# Patient Record
Sex: Female | Born: 1990 | Race: Black or African American | Hispanic: No | Marital: Single | Smoking: Never smoker
Health system: Southern US, Community
[De-identification: ages and names within clinical notes are randomized; demographics above are authoritative.]

## PROBLEM LIST (undated history)

## (undated) DIAGNOSIS — F419 Anxiety disorder, unspecified: Secondary | ICD-10-CM

## (undated) DIAGNOSIS — E119 Type 2 diabetes mellitus without complications: Secondary | ICD-10-CM

## (undated) DIAGNOSIS — F32A Depression, unspecified: Secondary | ICD-10-CM

## (undated) DIAGNOSIS — R040 Epistaxis: Secondary | ICD-10-CM

## (undated) DIAGNOSIS — F329 Major depressive disorder, single episode, unspecified: Secondary | ICD-10-CM

## (undated) DIAGNOSIS — G43909 Migraine, unspecified, not intractable, without status migrainosus: Secondary | ICD-10-CM

---
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
## 2010-10-18 ENCOUNTER — Emergency Department (HOSPITAL_BASED_OUTPATIENT_CLINIC_OR_DEPARTMENT_OTHER)
Admission: EM | Admit: 2010-10-18 | Discharge: 2010-10-18 | Disposition: A | Discharge status: Home / Self Care | Payer: Self-pay | Attending: Emergency Medicine | Admitting: Emergency Medicine

## 2010-10-18 ENCOUNTER — Encounter: Payer: Self-pay | Admitting: Family Medicine

## 2010-10-18 DIAGNOSIS — R339 Retention of urine, unspecified: Secondary | ICD-10-CM | POA: Insufficient documentation

## 2010-10-18 DIAGNOSIS — R109 Unspecified abdominal pain: Secondary | ICD-10-CM | POA: Insufficient documentation

## 2010-10-18 LAB — URINALYSIS, ROUTINE W REFLEX MICROSCOPIC
Bilirubin Urine: NEGATIVE
Bilirubin Urine: NEGATIVE
Glucose, UA: NEGATIVE mg/dL
Hgb urine dipstick: NEGATIVE
Ketones, ur: NEGATIVE
Ketones, ur: NEGATIVE mg/dL
Nitrite: NEGATIVE
Nitrite: NEGATIVE
Protein, ur: NEGATIVE mg/dL
Specific Gravity, Urine: 1.028
Specific Gravity, Urine: 1.017 (ref 1.005–1.030)
Urobilinogen, UA: 1
Urobilinogen, UA: 1 mg/dL (ref 0.0–1.0)
pH: 7.5 (ref 5.0–8.0)

## 2010-10-18 LAB — PREGNANCY, URINE: Preg Test, Ur: NEGATIVE

## 2010-10-18 LAB — URINE MICROSCOPIC-ADD ON

## 2010-10-18 LAB — WET PREP, GENITAL
Clue Cells Wet Prep HPF POC: NONE SEEN
Yeast Wet Prep HPF POC: NONE SEEN

## 2010-10-18 LAB — BASIC METABOLIC PANEL
BUN: 8 mg/dL (ref 6–23)
CO2: 25 mEq/L (ref 19–32)
Calcium: 10.2 mg/dL (ref 8.4–10.5)
Chloride: 102 mEq/L (ref 96–112)
Creatinine, Ser: 0.7 mg/dL (ref 0.50–1.10)
GFR calc Af Amer: >90 mL/min (ref >90)
GFR calc non Af Amer: >90 mL/min (ref >90)
Glucose, Bld: 129 mg/dL — ABNORMAL HIGH (ref 70–99)
Potassium: 3.9 mEq/L (ref 3.5–5.1)
Sodium: 139 mEq/L (ref 135–145)

## 2010-10-18 LAB — GC/CHLAMYDIA PROBE AMP, GENITAL: Chlamydia, DNA Probe: NEGATIVE

## 2010-10-18 NOTE — ED Notes (Signed)
Have encouraged Pt. To urinate and also to put on hospital gown.
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
## 2010-10-18 NOTE — ED Notes (Signed)
Pt c/o "not being able to urinate" x 2wks. Pt c/o lower abdomen and low back pain since last Tuesday.

## 2012-02-26 ENCOUNTER — Encounter (HOSPITAL_COMMUNITY): Payer: Self-pay | Admitting: *Deleted

## 2012-02-26 ENCOUNTER — Emergency Department (HOSPITAL_COMMUNITY): Payer: Self-pay

## 2012-02-26 ENCOUNTER — Emergency Department (HOSPITAL_COMMUNITY)
Admission: EM | Admit: 2012-02-26 | Discharge: 2012-02-26 | Disposition: A | Discharge status: Home / Self Care | Payer: Self-pay | Attending: Emergency Medicine | Admitting: Emergency Medicine

## 2012-02-26 DIAGNOSIS — S20229A Contusion of unspecified back wall of thorax, initial encounter: Secondary | ICD-10-CM

## 2012-02-26 DIAGNOSIS — W108XXA Fall (on) (from) other stairs and steps, initial encounter: Secondary | ICD-10-CM

## 2012-02-26 DIAGNOSIS — Y9229 Other specified public building as the place of occurrence of the external cause: Secondary | ICD-10-CM | POA: Insufficient documentation

## 2012-02-26 DIAGNOSIS — Y9301 Activity, walking, marching and hiking: Secondary | ICD-10-CM | POA: Insufficient documentation

## 2012-02-26 DIAGNOSIS — S0993XA Unspecified injury of face, initial encounter: Secondary | ICD-10-CM | POA: Insufficient documentation

## 2012-02-26 DIAGNOSIS — S0990XA Unspecified injury of head, initial encounter: Secondary | ICD-10-CM | POA: Insufficient documentation

## 2012-02-26 DIAGNOSIS — S8990XA Unspecified injury of unspecified lower leg, initial encounter: Secondary | ICD-10-CM | POA: Insufficient documentation

## 2012-02-26 MED ORDER — IBUPROFEN 800 MG PO TABS
800.0000 mg | ORAL_TABLET | Freq: Once | ORAL | Status: AC
  Administered 2012-02-26: 800 mg via ORAL
  Filled 2012-02-26: qty 1
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
## 2012-02-26 NOTE — ED Provider Notes (Signed)
This was a shared encounter with a resident.  On my exam this young female who presents following the falls in no distress, with no neurologic deficiencies.  Vital signs are stable.  Though there was questionable finding on the CT scan, the absence of neurologic findings, the absence of distress, is reassuring.  Gerhard Munch, MD 02/26/12 2326

## 2012-02-26 NOTE — ED Notes (Signed)
[id number]<AV> Expected date:02/26/12<BR> Expected time: 2:31 PM<BR> Means of arrival:Ambulance<BR> Comments:<BR> Fall

## 2012-02-26 NOTE — ED Notes (Signed)
Pt requesting no visitors at this time only godmother and she is not here.

## 2012-02-26 NOTE — ED Notes (Signed)
Per EMS: pt coming from church, fell backwards 2-3 steps, per EMS upon their arrival pt very anxious, hyperventilating c/o neck and back pain.
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
## 2012-02-26 NOTE — ED Notes (Signed)
Received pt in rm 16, pt hyperventilating, instructed pr to slow down her breathing; when asked about pain pt only moans, not answering any questions at this time. Pt asked multiple times to open her eyes; pupils are equal and reactive. Awaiting for MD to remove pt off of the LSB.

## 2014-06-05 ENCOUNTER — Emergency Department (INDEPENDENT_AMBULATORY_CARE_PROVIDER_SITE_OTHER)
Admission: EM | Admit: 2014-06-05 | Discharge: 2014-06-05 | Disposition: A | Discharge status: Home / Self Care | Payer: Self-pay | Source: Home / Self Care | Attending: Family Medicine | Admitting: Family Medicine

## 2014-06-05 ENCOUNTER — Encounter (HOSPITAL_COMMUNITY): Payer: Self-pay | Admitting: Emergency Medicine

## 2014-06-05 DIAGNOSIS — K2100 Gastro-esophageal reflux disease with esophagitis, without bleeding: Secondary | ICD-10-CM

## 2014-06-05 DIAGNOSIS — A084 Viral intestinal infection, unspecified: Secondary | ICD-10-CM

## 2014-06-05 MED ORDER — OMEPRAZOLE 40 MG PO CPDR: 40.0000 mg | DELAYED_RELEASE_CAPSULE | Freq: Every day | ORAL | Status: DC

## 2014-06-05 MED ORDER — ONDANSETRON HCL 4 MG PO TABS: 4.0000 mg | ORAL_TABLET | Freq: Three times a day (TID) | ORAL | Status: DC | PRN

## 2014-06-05 NOTE — ED Notes (Signed)
Patient c/o burning sensations in her mouth and tongue x 10 days. Patient denies any change in diet. Patient has been gargling salt water with no change. She reports her taste buds seem more prominent. Patient is in NAD.

## 2014-06-05 NOTE — Discharge Instructions (Signed)
The cause of your symptoms is not immediately clear but may be due to severe reflux as well as a stomach infection called viral gastroenteritis. The symptoms should resolve over the next 3-4 days. Please go to the emergency room if you get worse. Please start the Prilosec and take it for 14 days then as needed. Please use the Zofran for nausea.

## 2014-06-05 NOTE — ED Provider Notes (Signed)
CSN: [id number]     Arrival date & time 06/05/14  [id number] History   First MD Initiated Contact with Patient 06/05/14 1118     Chief Complaint  Patient presents with  . Oral Swelling   (Consider location/radiation/quality/duration/timing/severity/associated sxs/prior Treatment) HPI  Tongue pain. Started 11 days ago. Worse liquids. Decreased taste w/ foods. Salt water gargling w/ a few seconds of improvement. Constant. Getting worse. Associated w/ vomiting and diarrhea. 3x emesis daily and 2 x diarrhea daily. Non-bloody emesis and BM. Denies abd pain, dysuria, frequency, fevers, CP, SOB, palpitations, lower abdominal pain, vaginal discharge, back pain. Endorses some heartburn over the last several weeks. Not sexually active - still a virgin.  No visual abnormality of the mouth per pt.    History reviewed. No pertinent past medical history. History reviewed. No pertinent past surgical history. Family History  Problem Relation Age of Onset  . Hypertension Mother   . Gout Father    History  Substance Use Topics  . Smoking status: Never Smoker   . Smokeless tobacco: Not on file  . Alcohol Use: No   OB History    No data available     Review of Systems Per HPI with all other pertinent systems negative.   Allergies  Review of patient's allergies indicates no known allergies.  Home Medications   Prior to Admission medications   Medication Sig Start Date End Date Taking? Authorizing Provider  acetaminophen (TYLENOL) 500 MG tablet Take 500 mg by mouth every 6 (six) hours as needed for pain.    Historical Provider, MD  ibuprofen (ADVIL,MOTRIN) 200 MG tablet Take 400 mg by mouth every 6 (six) hours as needed for pain.    Historical Provider, MD  omeprazole (PRILOSEC) 40 MG capsule Take 1 capsule (40 mg total) by mouth daily. 06/05/14   Ozella Rocksavid J Merrell, MD  ondansetron (ZOFRAN) 4 MG tablet Take 1 tablet (4 mg total) by mouth every 8 (eight) hours as needed for nausea or vomiting. 06/05/14    Ozella Rocksavid J Merrell, MD   BP 125/64 mmHg  Pulse 73  Temp(Src) 98.4 F (36.9 C) (Oral)  Resp 16  SpO2 100%  LMP 05/19/2014 Physical Exam Physical Exam  Constitutional: oriented to person, place, and time. appears well-developed and well-nourished. No distress.  HENT:  Head: Normocephalic and atraumatic.  Oropharyngeal cavity normal. Tongue normal. Articulation normal. No cervical lymphadenopathy Eyes: EOMI. PERRL.  Neck: Normal range of motion.  Cardiovascular: RRR, no m/r/g, 2+ distal pulses,  Pulmonary/Chest: Effort normal and breath sounds normal. No respiratory distress.  Abdominal: Soft. Bowel sounds are normal. NonTTP, no distension.  Musculoskeletal: Normal range of motion. Non ttp, no effusion.  Neurological: alert and oriented to person, place, and time.  Skin: Skin is warm. No rash noted. non diaphoretic.  Psychiatric: normal mood and affect. behavior is normal. Judgment and thought content normal.   ED Course  Procedures (including critical care time) Labs Review Labs Reviewed - No data to display  Imaging Review No results found.   MDM   1. Reflux esophagitis   2. Viral gastroenteritis    Patient symptoms likely secondary to reflux esophagitis complicated by a viral gastroenteritis. Zofran for nausea. Patient to start Imodium and a probiotic. Start Prilosec and use for 14 days then as needed. Patient can also start using Zantac for additional relief. Patient go to the emergency room she gets worse.    Ozella Rocksavid J Merrell, MD 06/05/14 [id number]

## 2014-12-10 ENCOUNTER — Encounter (HOSPITAL_COMMUNITY): Payer: Self-pay | Admitting: Emergency Medicine

## 2014-12-10 ENCOUNTER — Emergency Department (HOSPITAL_COMMUNITY)
Admission: EM | Admit: 2014-12-10 | Discharge: 2014-12-10 | Disposition: A | Discharge status: Home / Self Care | Payer: Self-pay | Attending: Emergency Medicine | Admitting: Emergency Medicine

## 2014-12-10 DIAGNOSIS — R51 Headache: Secondary | ICD-10-CM | POA: Insufficient documentation

## 2014-12-10 DIAGNOSIS — R040 Epistaxis: Secondary | ICD-10-CM

## 2014-12-10 DIAGNOSIS — R42 Dizziness and giddiness: Secondary | ICD-10-CM | POA: Insufficient documentation

## 2014-12-10 LAB — CBC WITH DIFFERENTIAL/PLATELET
BASOS ABS: 0 10*3/uL (ref 0.0–0.1)
BASOS PCT: 0 %
Eosinophils Absolute: 0.1 10*3/uL (ref 0.0–0.7)
Eosinophils Relative: 1 %
HEMATOCRIT: 38.7 % (ref 36.0–46.0)
HEMOGLOBIN: 12.3 g/dL (ref 12.0–15.0)
LYMPHS PCT: 19 %
Lymphs Abs: 1.7 10*3/uL (ref 0.7–4.0)
MCH: 25.2 pg — ABNORMAL LOW (ref 26.0–34.0)
MCHC: 31.8 g/dL (ref 30.0–36.0)
MCV: 79.3 fL (ref 78.0–100.0)
MONO ABS: 0.6 10*3/uL (ref 0.1–1.0)
Monocytes Relative: 6 %
NEUTROS ABS: 6.9 10*3/uL (ref 1.7–7.7)
NEUTROS PCT: 74 %
Platelets: 332 10*3/uL (ref 150–400)
RBC: 4.88 MIL/uL (ref 3.87–5.11)
RDW: 14 % (ref 11.5–15.5)
WBC: 9.3 10*3/uL (ref 4.0–10.5)

## 2014-12-10 NOTE — ED Notes (Signed)
Patient states having nose bleeds daily for 2 weeks.   Patient states last one this morning around 0915.   Patient states they usually last approximately 3 minutes, but the one today lasted 5 minutes and "it was a lot of blood".   Patient denies other symptoms.

## 2014-12-10 NOTE — Discharge Instructions (Signed)
Follow up with primary care doctor. See information below.    Nosebleed Nosebleeds are common. They are due to a crack in the inside lining of your nose (mucous membrane) or from a small blood vessel that starts to bleed. Nosebleeds can be caused by many conditions, such as injury, infections, dry mucous membranes or dry climate, medicines, nose picking, and home heating and cooling systems. Most nosebleeds come from blood vessels in the front of your nose. HOME CARE INSTRUCTIONS   Try controlling your nosebleed by pinching your nostrils gently and continuously for at least 10 minutes.  Avoid blowing or sniffing your nose for a number of hours after having a nosebleed.  Do not put gauze inside your nose yourself. If your nose was packed by your health care provider, try to maintain the pack inside of your nose until your health care provider removes it.  If a gauze pack was used and it starts to fall out, gently replace it or cut off the end of it.  If a balloon catheter was used to pack your nose, do not cut or remove it unless your health care provider has instructed you to do that.  Avoid lying down while you are having a nosebleed. Sit up and lean forward.  Use a nasal spray decongestant to help with a nosebleed as directed by your health care provider.  Do not use petroleum jelly or mineral oil in your nose. These can drip into your lungs.  Maintain humidity in your home by using less air conditioning or by using a humidifier.  Aspirinand blood thinners make bleeding more likely. If you are prescribed these medicines and you suffer from nosebleeds, ask your health care provider if you should stop taking the medicines or adjust the dose. Do not stop medicines unless directed by your health care provider  Resume your normal activities as you are able, but avoid straining, lifting, or bending at the waist for several days.  If your nosebleed was caused by dry mucous membranes, use  over-the-counter saline nasal spray or gel. This will keep the mucous membranes moist and allow them to heal. If you must use a lubricant, choose the water-soluble variety. Use it only sparingly, and do not use it within several hours of lying down.  Keep all follow-up visits as directed by your health care provider. This is important. SEEK MEDICAL CARE IF:  You have a fever.  You get frequent nosebleeds.  You are getting nosebleeds more often. SEEK IMMEDIATE MEDICAL CARE IF:  Your nosebleed lasts longer than 20 minutes.  Your nosebleed occurs after an injury to your face, and your nose looks crooked or broken.  You have unusual bleeding from other parts of your body.  You have unusual bruising on other parts of your body.  You feel light-headed or you faint.  You become sweaty.  You vomit blood.  Your nosebleed occurs after a head injury.   This information is not intended to replace advice given to you by your health care provider. Make sure you discuss any questions you have with your health care provider.   Document Released: 10/13/2004 Document Revised: 01/24/2014 Document Reviewed: 08/19/2013 Elsevier Interactive Patient Education [employer]2016 [employer]

## 2014-12-10 NOTE — ED Notes (Signed)
Pt is in stable condition upon d/c an ambulates from ED. 

## 2014-12-10 NOTE — ED Provider Notes (Signed)
CSN: [id number]     Arrival date & time 12/10/14  4098 History   First MD Initiated Contact with Patient 12/10/14 1003     Chief Complaint  Patient presents with  . Epistaxis     (Consider location/radiation/quality/duration/timing/severity/associated sxs/prior Treatment) HPI Martha Lopez is a 24 y.o. female with no medical problems, presents to emergency department complaining bloody nose. Patient states that she has had bleeding from bilateral nares every day for the last 2 weeks. Patient states episodes occur random. She denies any nasal trauma. She denies any pain. She states she is usually able to stop the bleeding with pressure. She denies any active bleeding at this time. She reports occasional headaches. Also reports some dizziness. Last episode was at 9 AM this morning. Denies history of blood clotting disorders and she does not take any blood thinners. Does not take any supplements or over-the-counter medications.  History reviewed. No pertinent past medical history. History reviewed. No pertinent past surgical history. Family History  Problem Relation Age of Onset  . Hypertension Mother   . Gout Father    Social History  Substance Use Topics  . Smoking status: Never Smoker   . Smokeless tobacco: None  . Alcohol Use: No   OB History    No data available     Review of Systems  Constitutional: Negative for fever and chills.  HENT: Positive for nosebleeds. Negative for facial swelling and mouth sores.   Respiratory: Negative for cough, chest tightness and shortness of breath.   Cardiovascular: Negative for chest pain, palpitations and leg swelling.  Gastrointestinal: Negative for nausea, vomiting, abdominal pain and diarrhea.  Musculoskeletal: Negative for myalgias, arthralgias, neck pain and neck stiffness.  Skin: Negative for rash.  Neurological: Positive for dizziness and headaches. Negative for weakness.  All other systems reviewed and are  negative.     Allergies  Review of patient's allergies indicates no known allergies.  Home Medications   Prior to Admission medications   Medication Sig Start Date End Date Taking? Authorizing Provider  acetaminophen (TYLENOL) 500 MG tablet Take 500 mg by mouth every 6 (six) hours as needed for pain.    Historical Provider, MD  ibuprofen (ADVIL,MOTRIN) 200 MG tablet Take 400 mg by mouth every 6 (six) hours as needed for pain.    Historical Provider, MD  omeprazole (PRILOSEC) 40 MG capsule Take 1 capsule (40 mg total) by mouth daily. Patient not taking: Reported on 12/10/2014 06/05/14   Ozella Rocks, MD  ondansetron (ZOFRAN) 4 MG tablet Take 1 tablet (4 mg total) by mouth every 8 (eight) hours as needed for nausea or vomiting. Patient not taking: Reported on 12/10/2014 06/05/14   Ozella Rocks, MD   BP 126/74 mmHg  Pulse 78  Temp(Src) 98.6 F (37 C) (Oral)  Resp 19  Ht  (1.575 m)  Wt 85.73 kg  BMI 34.56 kg/m2  SpO2 100%  LMP 12/10/2014 Physical Exam  Constitutional: She is oriented to person, place, and time. She appears well-developed and well-nourished. No distress.  HENT:  Head: Normocephalic.  Dried blood in bilateral nares. No active bleeding.   Eyes: Conjunctivae and EOM are normal. Pupils are equal, round, and reactive to light.  Neck: Neck supple.  Cardiovascular: Normal rate, regular rhythm and normal heart sounds.   Pulmonary/Chest: Effort normal and breath sounds normal. No respiratory distress. She has no wheezes. She has no rales.  Abdominal: Soft. Bowel sounds are normal. She exhibits no distension. There is no  tenderness. There is no rebound.  Musculoskeletal: She exhibits no edema.  Neurological: She is alert and oriented to person, place, and time. No cranial nerve deficit. Coordination normal.  Skin: Skin is warm and dry.  Psychiatric: She has a normal mood and affect. Her behavior is normal.  Nursing note and vitals reviewed.   ED Course   Procedures (including critical care time) Labs Review Labs Reviewed  CBC WITH DIFFERENTIAL/PLATELET - Abnormal; Notable for the following:    MCH 25.2 (*)    All other components within normal limits    Imaging Review No results found. I have personally reviewed and evaluated these images and lab results as part of my medical decision-making.   EKG Interpretation None      MDM   Final diagnoses:  Epistaxis    Patient emergency department with epistaxis. No active bleeding. No history of bleeding disorders. She is not taking her blood thinners. She has no other associated complaints. Denies joint aches. No bruising or skin discoloration. CBC normal. Home with vasiline to nares, advised to get a humidifier, pressure if bleeding. Follow up with primary care doctor.   Filed Vitals:   12/10/14 0957 12/10/14 0959  BP:  126/74  Pulse:  78  Temp:  98.6 F (37 C)  TempSrc:  Oral  Resp:  19  Height: 5\' 2"  (1.575 m)   Weight: 85.73 kg   SpO2:  100%       Jaynie Crumbleatyana Elior Robinette, PA-C 12/10/14 1130  Melene Planan Floyd, DO 12/10/14 1346

## 2015-10-21 ENCOUNTER — Emergency Department (HOSPITAL_COMMUNITY)
Admission: EM | Admit: 2015-10-21 | Discharge: 2015-10-22 | Disposition: A | Discharge status: Home / Self Care | Payer: No Typology Code available for payment source | Attending: Emergency Medicine | Admitting: Emergency Medicine

## 2015-10-21 ENCOUNTER — Encounter (HOSPITAL_COMMUNITY): Payer: Self-pay | Admitting: *Deleted

## 2015-10-21 DIAGNOSIS — Y92410 Unspecified street and highway as the place of occurrence of the external cause: Secondary | ICD-10-CM | POA: Diagnosis not present

## 2015-10-21 DIAGNOSIS — S0083XA Contusion of other part of head, initial encounter: Secondary | ICD-10-CM | POA: Diagnosis not present

## 2015-10-21 DIAGNOSIS — Y999 Unspecified external cause status: Secondary | ICD-10-CM | POA: Diagnosis not present

## 2015-10-21 DIAGNOSIS — Y939 Activity, unspecified: Secondary | ICD-10-CM | POA: Diagnosis not present

## 2015-10-21 DIAGNOSIS — S0990XA Unspecified injury of head, initial encounter: Secondary | ICD-10-CM | POA: Diagnosis present
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
## 2015-10-22 MED ORDER — CYCLOBENZAPRINE HCL 5 MG PO TABS: 5.0000 mg | ORAL_TABLET | Freq: Three times a day (TID) | ORAL | 0 refills | Status: DC | PRN

## 2015-10-22 MED ORDER — CYCLOBENZAPRINE HCL 10 MG PO TABS
5.0000 mg | ORAL_TABLET | Freq: Once | ORAL | Status: AC
  Administered 2015-10-22: 5 mg via ORAL
  Filled 2015-10-22: qty 1

## 2015-10-22 MED ORDER — IBUPROFEN 400 MG PO TABS
600.0000 mg | ORAL_TABLET | Freq: Once | ORAL | Status: AC
  Administered 2015-10-22: 600 mg via ORAL
  Filled 2015-10-22: qty 1

## 2015-10-22 MED ORDER — NAPROXEN 500 MG PO TABS: 500.0000 mg | ORAL_TABLET | Freq: Two times a day (BID) | ORAL | 0 refills | Status: DC
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
## 2015-10-22 NOTE — Discharge Instructions (Signed)
Do not take the muscle relaxant if driving as it will make you sleepy.  °

## 2016-09-15 ENCOUNTER — Encounter (HOSPITAL_COMMUNITY): Payer: Self-pay | Admitting: Emergency Medicine

## 2016-09-15 ENCOUNTER — Ambulatory Visit (HOSPITAL_COMMUNITY)
Admission: EM | Admit: 2016-09-15 | Discharge: 2016-09-15 | Disposition: A | Discharge status: Home / Self Care | Payer: Self-pay | Attending: Family Medicine | Admitting: Family Medicine

## 2016-09-15 DIAGNOSIS — R51 Headache: Secondary | ICD-10-CM

## 2016-09-15 DIAGNOSIS — R519 Headache, unspecified: Secondary | ICD-10-CM

## 2016-09-15 MED ORDER — KETOROLAC TROMETHAMINE 60 MG/2ML IM SOLN
INTRAMUSCULAR | Status: AC
  Filled 2016-09-15: qty 2

## 2016-09-15 MED ORDER — KETOROLAC TROMETHAMINE 60 MG/2ML IM SOLN
60.0000 mg | Freq: Once | INTRAMUSCULAR | Status: AC
  Administered 2016-09-15: 60 mg via INTRAMUSCULAR

## 2016-09-15 MED ORDER — ONDANSETRON 4 MG PO TBDP
ORAL_TABLET | ORAL | Status: AC
  Filled 2016-09-15: qty 1

## 2016-09-15 MED ORDER — ONDANSETRON 4 MG PO TBDP
4.0000 mg | ORAL_TABLET | Freq: Once | ORAL | Status: AC
  Administered 2016-09-15: 4 mg via ORAL

## 2016-09-15 NOTE — ED Triage Notes (Signed)
Pt reports a headache x4 days.  She reports pain in her eyes, sleeplessness with vomiting last night and she states she feels the head hurting when she walks and sneezes.

## 2016-09-15 NOTE — ED Provider Notes (Signed)
  [facility]   [id number] 09/15/16 Arrival Time: 1536  ASSESSMENT & PLAN:  1. Acute nonintractable headache, unspecified headache type     Meds ordered this encounter  Medications  . ketorolac (TORADOL) injection 60 mg  . ondansetron (ZOFRAN-ODT) disintegrating tablet 4 mg   Feeling better after IM medications in office. Headache precautions given. Will seek prompt medical attention if worsens. Reviewed expectations re: course of current medical issues. Questions answered. Outlined signs and symptoms indicating need for more acute intervention. Patient verbalized understanding. After Visit Summary given.   SUBJECTIVE:  Martha Lopez is a 26 y.o. female who presents with complaint of a headache for 3-4 days. On/off. Gradual onset. Describes as throbbing "all over." Does feel in neck also. Afebrile. No confusion. No recent illnesses. Occasional headaches like this. Usu resolve with OTC. Ambulatory without problems. Isolated episode of emesis today. None since. Normal PO intake. Vision normal.  ROS: As per HPI.   OBJECTIVE:  Vitals:   09/15/16 1610  BP: 110/76  Pulse: 95  Resp: 16  Temp: 99.3 F (37.4 C)  TempSrc: Oral  SpO2: 98%    General appearance: alert; no distress Eyes: PERRLA; EOMI; conjunctiva normal HENT: normocephalic; atraumatic; TMs normal; nasal mucosa normal; oral mucosa normal Neck: supple Lungs: clear to auscultation bilaterally Heart: regular rate and rhythm Extremities: no cyanosis or edema; symmetrical with no gross deformities Skin: warm and dry Neurologic: normal gait; normal symmetric reflexes Psychological: alert and cooperative; normal mood and affect  No Known Allergies  History reviewed. No pertinent past medical history. Social History   Social History  . Marital status: Single    Spouse name: N/A  . Number of children: N/A  . Years of education: N/A   Occupational History  . Not on file.   Social History Main  Topics  . Smoking status: Never Smoker  . Smokeless tobacco: Never Used  . Alcohol use No  . Drug use: No  . Sexual activity: No   Other Topics Concern  . Not on file   Social History Narrative  . No narrative on file   Family History  Problem Relation Age of Onset  . Hypertension Mother   . Gout Father    History reviewed. No pertinent surgical history.   Mardella LaymanHagler, Laelyn Blumenthal, MD 09/17/16 1049

## 2017-07-10 ENCOUNTER — Ambulatory Visit (HOSPITAL_COMMUNITY)
Admission: EM | Admit: 2017-07-10 | Discharge: 2017-07-10 | Disposition: A | Discharge status: Home / Self Care | Payer: Self-pay | Attending: Family Medicine | Admitting: Family Medicine

## 2017-07-10 ENCOUNTER — Encounter (HOSPITAL_COMMUNITY): Payer: Self-pay

## 2017-07-10 DIAGNOSIS — Z8249 Family history of ischemic heart disease and other diseases of the circulatory system: Secondary | ICD-10-CM | POA: Insufficient documentation

## 2017-07-10 DIAGNOSIS — Z3202 Encounter for pregnancy test, result negative: Secondary | ICD-10-CM

## 2017-07-10 DIAGNOSIS — J029 Acute pharyngitis, unspecified: Secondary | ICD-10-CM | POA: Insufficient documentation

## 2017-07-10 DIAGNOSIS — Z79899 Other long term (current) drug therapy: Secondary | ICD-10-CM | POA: Insufficient documentation

## 2017-07-10 DIAGNOSIS — R1030 Lower abdominal pain, unspecified: Secondary | ICD-10-CM | POA: Insufficient documentation

## 2017-07-10 DIAGNOSIS — K1370 Unspecified lesions of oral mucosa: Secondary | ICD-10-CM | POA: Insufficient documentation

## 2017-07-10 LAB — POCT URINALYSIS DIP (DEVICE)
GLUCOSE, UA: NEGATIVE mg/dL
Ketones, ur: 80 mg/dL — AB
Leukocytes, UA: NEGATIVE
Nitrite: NEGATIVE
PH: 5.5 (ref 5.0–8.0)
PROTEIN: 30 mg/dL — AB
SPECIFIC GRAVITY, URINE: 1.025 (ref 1.005–1.030)
UROBILINOGEN UA: 1 mg/dL (ref 0.0–1.0)

## 2017-07-10 LAB — POCT RAPID STREP A: Streptococcus, Group A Screen (Direct): NEGATIVE

## 2017-07-10 LAB — POCT PREGNANCY, URINE: Preg Test, Ur: NEGATIVE

## 2017-07-10 MED ORDER — MAGIC MOUTHWASH W/LIDOCAINE: 5.0000 mL | Freq: Four times a day (QID) | ORAL | 0 refills | Status: AC

## 2017-07-10 NOTE — ED Triage Notes (Signed)
Pt presents with complaints of sores in right side of mouth, headache, swollen lymph nodes in neck on right side and lower abdominal pain x 3 days

## 2017-07-10 NOTE — Discharge Instructions (Signed)
These lesions are most likely viral and will improve on their own  Please use mouthwash up to 4 times a day  Please take Tylenol and ibuprofen for pain  Please return if symptoms worsening, not improving in 5 to 7 days.

## 2017-07-10 NOTE — ED Provider Notes (Signed)
[facility]    CSN: [id number] Arrival date & time: 07/10/17  1004     History   Chief Complaint Chief Complaint  Patient presents with  . Mouth Lesions    HPI Martha Lopez is a 27 y.o. female no significant past medical history presenting today for evaluation of mouth sores, sore throat, abdominal pain.  Patient states that her symptoms began last Wednesday with a headache and have continued to progress and worsen.  Since she has developed lower abdominal pain for 3 days as well as mouth sores, more specifically on the right side of her mouth.  She has noticed some sore throat and pain with swallowing.  Decreased appetite and one episode of vomiting last night.  She has not had persistent nausea vomiting.  Denies any changes in bowel movements.  Denies diarrhea.  Denies associated dysuria, increased frequency or vaginal discharge.  She declines concern for STDs.  She denies previously having mouth sores similarly.  She denies any associated rash with these sores.  She has been taking ibuprofen, naproxen, salt water gargles as well as peroxide without relief.  Last menstrual period was 5/12, she is not on any form of birth control.  States her periods are typically regular.  HPI   History reviewed. No pertinent past medical history.  Patient Active Problem List   Diagnosis Date Noted  . Fall down stairs 02/26/2012  . Contusion of back(922.31) 02/26/2012    History reviewed. No pertinent surgical history.  OB History   None      Home Medications    Prior to Admission medications   Medication Sig Start Date End Date Taking? Authorizing Provider  acetaminophen (TYLENOL) 500 MG tablet Take 500 mg by mouth every 6 (six) hours as needed for pain.    [provider]  cyclobenzaprine (FLEXERIL) 5 MG tablet Take 1 tablet (5 mg total) by mouth 3 (three) times daily as needed for muscle spasms. 10/22/15   Janne Napoleon, NP  ibuprofen (ADVIL,MOTRIN) 200 MG tablet  Take 400 mg by mouth every 6 (six) hours as needed for pain.    [provider]  magic mouthwash w/lidocaine SOLN Take 5 mLs by mouth 4 (four) times daily for 7 days. 07/10/17 07/17/17  Copeland Neisen C, PA-C  naproxen (NAPROSYN) 500 MG tablet Take 1 tablet (500 mg total) by mouth 2 (two) times daily. 10/22/15   Janne Napoleon, NP  omeprazole (PRILOSEC) 40 MG capsule Take 1 capsule (40 mg total) by mouth daily. Patient not taking: Reported on 12/10/2014 06/05/14   Ozella Rocks, MD  ondansetron (ZOFRAN) 4 MG tablet Take 1 tablet (4 mg total) by mouth every 8 (eight) hours as needed for nausea or vomiting. Patient not taking: Reported on 12/10/2014 06/05/14   Ozella Rocks, MD    Family History Family History  Problem Relation Age of Onset  . Hypertension Mother   . Gout Father     Social History Social History   Tobacco Use  . Smoking status: Never Smoker  . Smokeless tobacco: Never Used  Substance Use Topics  . Alcohol use: No  . Drug use: No     Allergies   Patient has no known allergies.   Review of Systems Review of Systems  Constitutional: Positive for appetite change. Negative for activity change, chills, fatigue and fever.  HENT: Positive for mouth sores and sore throat. Negative for congestion, ear pain, rhinorrhea, sinus pressure and trouble swallowing.   Eyes:  Negative for discharge and redness.  Respiratory: Negative for cough, chest tightness and shortness of breath.   Cardiovascular: Negative for chest pain.  Gastrointestinal: Positive for abdominal pain. Negative for diarrhea, nausea and vomiting.  Genitourinary: Negative for dysuria, flank pain, genital sores, hematuria and vaginal discharge.  Musculoskeletal: Negative for myalgias.  Skin: Negative for rash.  Neurological: Positive for headaches. Negative for dizziness and light-headedness.     Physical Exam Triage Vital Signs ED Triage Vitals  Enc Vitals Group     BP 07/10/17 1031 138/82      Pulse Rate 07/10/17 1031 96     Resp 07/10/17 1031 19     Temp 07/10/17 1031 99.2 F (37.3 C)     Temp src --      SpO2 07/10/17 1031 100 %     Weight --      Height --      Head Circumference --      Peak Flow --      Pain Score 07/10/17 1032 10     Pain Loc --      Pain Edu? --      Excl. in GC? --    No data found.  Updated Vital Signs BP 138/82   Pulse 96   Temp 99.2 F (37.3 C)   Resp 19   LMP 05/28/2017   SpO2 100%   Visual Acuity Right Eye Distance:   Left Eye Distance:   Bilateral Distance:    Right Eye Near:   Left Eye Near:    Bilateral Near:     Physical Exam  Constitutional: She is oriented to person, place, and time. She appears well-developed and well-nourished. No distress.  HENT:  Head: Normocephalic and atraumatic.  Bilateral ears without tenderness to palpation of external auricle, tragus and mastoid, EAC's without erythema or swelling, TM's with good bony landmarks and cone of light. Non erythematous.  Oral mucosa -with layers of white discoloration, nonremovable to bilateral bugle mucosa and in her leg, multiple erythematous lesions to right mucosa near her mouth.,  Mild tonsillar enlargement with white exudate present bilaterally. Posterior pharynx patent and nonerythematous, no uvula deviation or swelling. Normal phonation.   Eyes: Conjunctivae are normal.  Neck: Neck supple.  Tenderness to palpation of submandibular lymph nodes on right side.  Cardiovascular: Normal rate and regular rhythm.  No murmur heard. Pulmonary/Chest: Effort normal and breath sounds normal. No respiratory distress.  Breathing comfortably at rest, CTABL, no wheezing, rales or other adventitious sounds auscultated  Abdominal: Soft. There is tenderness.  Tenderness to bilateral right and left lower quadrants, negative rebound, negative Rovsing, negative McBurney's  Musculoskeletal: She exhibits no edema.  Neurological: She is alert and oriented to person, place, and  time.  Skin: Skin is warm and dry.  No rash or lesions to hands and feet/soles  Psychiatric: She has a normal mood and affect.  Nursing note and vitals reviewed.        UC Treatments / Results  Labs (all labs ordered are listed, but only abnormal results are displayed) Labs Reviewed  POCT URINALYSIS DIP (DEVICE) - Abnormal; Notable for the following components:      Result Value   Bilirubin Urine SMALL (*)    Ketones, ur 80 (*)    Hgb urine dipstick TRACE (*)    Protein, ur 30 (*)    All other components within normal limits  CULTURE, GROUP A STREP [facility])  HSV CULTURE AND TYPING  POCT RAPID STREP A  POCT PREGNANCY, URINE    EKG None  Radiology No results found.  Procedures Procedures (including critical care time)  Medications Ordered in UC Medications - No data to display  Initial Impression / Assessment and Plan / UC Course  I have reviewed the triage vital signs and the nursing notes.  Pertinent labs & imaging results that were available during my care of the patient were reviewed by me and considered in my medical decision making (see chart for details).     Patient with oral lesions, sore throat, strep test negative, HSV culture obtained.  These appear more viral aphthous ulcers versus hand-foot-and-mouth.  Will provide symptomatic treatment with Magic mouthwash with lidocaine.  Tylenol and ibuprofen for pain and swelling.  Monitor abdominal pain, at this time does not appear to have acute abdomen, likely related to viral illness.  UA negative, pregnancy negative Final Clinical Impressions(s) / UC Diagnoses   Final diagnoses:  Oral lesion  Sore throat     Discharge Instructions     These lesions are most likely viral and will improve on their own  Please use mouthwash up to 4 times a day  Please take Tylenol and ibuprofen for pain  Please return if symptoms worsening, not improving in 5 to 7 days.    ED Prescriptions    Medication Sig  Dispense Auth. Provider   magic mouthwash w/lidocaine SOLN Take 5 mLs by mouth 4 (four) times daily for 7 days. 200 mL Jayma Volpi C, PA-C     Controlled Substance Prescriptions [place] Controlled Substance Registry consulted? Not Applicable   Lew DawesWieters, Maykel Reitter C, [place]PA-C 07/10/17 1119

## 2017-07-12 ENCOUNTER — Emergency Department (HOSPITAL_COMMUNITY)
Admission: EM | Admit: 2017-07-12 | Discharge: 2017-07-12 | Disposition: A | Discharge status: Home / Self Care | Payer: Self-pay | Attending: Emergency Medicine | Admitting: Emergency Medicine

## 2017-07-12 ENCOUNTER — Other Ambulatory Visit: Payer: Self-pay

## 2017-07-12 DIAGNOSIS — K029 Dental caries, unspecified: Secondary | ICD-10-CM | POA: Insufficient documentation

## 2017-07-12 DIAGNOSIS — K047 Periapical abscess without sinus: Secondary | ICD-10-CM | POA: Insufficient documentation

## 2017-07-12 DIAGNOSIS — K1370 Unspecified lesions of oral mucosa: Secondary | ICD-10-CM | POA: Insufficient documentation

## 2017-07-12 LAB — CULTURE, GROUP A STREP (THRC)

## 2017-07-12 LAB — HSV CULTURE AND TYPING

## 2017-07-12 MED ORDER — ACETAMINOPHEN 500 MG PO TABS: 500.0000 mg | ORAL_TABLET | Freq: Four times a day (QID) | ORAL | 0 refills | Status: DC | PRN

## 2017-07-12 MED ORDER — IBUPROFEN 600 MG PO TABS: 600.0000 mg | ORAL_TABLET | Freq: Four times a day (QID) | ORAL | 0 refills | Status: DC | PRN

## 2017-07-12 MED ORDER — PENICILLIN V POTASSIUM 500 MG PO TABS: 500.0000 mg | ORAL_TABLET | Freq: Four times a day (QID) | ORAL | 0 refills | Status: AC

## 2017-07-12 NOTE — Discharge Instructions (Signed)
Medications: Penicillin, ibuprofen, Tylenol  Treatment: Take penicillin 4 times daily.  Make sure to finish all  of this medication.  Alternate ibuprofen and Tylenol as needed for your pain.  You can continue taking Magic mouthwash as prescribed.  Please follow-up with a dentist as soon as possible for further evaluation and treatment of your symptoms.  Follow-up: Please follow-up with a dentist as soon as possible for further evaluation and treatment of your dental infection.  If your symptoms are not improving, please establish care with a primary care provider.  You can call the urgent care or my chart for your HSV test that is pending.  If you do not hear from them soon it is positive, you can call for further treatment of this.  Please return to the emergency department if you develop any new or worsening symptoms.

## 2017-07-12 NOTE — ED Triage Notes (Signed)
Patient to Martha Lopez c/o mouth pain - [facility] and given magic mouthwash without relief. Endorses canker sores all over, making eating difficult. Denies fevers/chills.

## 2017-07-12 NOTE — ED Provider Notes (Signed)
Patient placed in Quick Look pathway, seen and evaluated   Chief Complaint: mouth sores  HPI:   Seen at urgent care 2 days ago, given magic mouthwash no improvement   ROS: mouth sores (one)  Physical Exam:   Gen: No distress  Neuro: Awake and Alert  Skin: Warm    Focused Exam: Shallow ulcerations around hard palate and bucccal mucosa   Initiation of care has begun. The patient has been counseled on the process, plan, and necessity for staying for the completion/evaluation, and the remainder of the medical screening examination    Arthor CaptainHarris, Louis Gaw, Cordelia Poche-C 07/12/17 1629    Mesner, Barbara CowerJason, MD 07/13/17 [phone]

## 2017-07-12 NOTE — ED Provider Notes (Signed)
[facility] EMERGENCY DEPARTMENT Provider Note   CSN: [id number] Arrival date & time: 07/12/17  1545     History   Chief Complaint Chief Complaint  Patient presents with  . Mouth Pain    HPI Martha Lopez is a 27 y.o. female who is previously healthy who presents with a one-week history of painful oral lesions noted after her right upper molar broke off.  She is having dental pain.  She denies any fevers, or neck pain.  She was seen at urgent care 2 days ago and prescribed Magic mouthwash.  HSV is pending and strep culture is negative.  Patient denies any history of HSV or oral sex. She reports that she initially had a sore throat, however this is resolved.   HPI  No past medical history on file.  Patient Active Problem List   Diagnosis Date Noted  . Fall down stairs 02/26/2012  . Contusion of back(922.31) 02/26/2012    No past surgical history on file.   OB History   None      Home Medications    Prior to Admission medications   Medication Sig Start Date End Date Taking? Authorizing Provider  acetaminophen (TYLENOL) 500 MG tablet Take 1 tablet (500 mg total) by mouth every 6 (six) hours as needed. 07/12/17   Michiah Masse, Waylan Boga, PA-C  cyclobenzaprine (FLEXERIL) 5 MG tablet Take 1 tablet (5 mg total) by mouth 3 (three) times daily as needed for muscle spasms. 10/22/15   Janne Napoleon, NP  ibuprofen (ADVIL,MOTRIN) 600 MG tablet Take 1 tablet (600 mg total) by mouth every 6 (six) hours as needed. 07/12/17   Emi Holes, PA-C  magic mouthwash w/lidocaine SOLN Take 5 mLs by mouth 4 (four) times daily for 7 days. 07/10/17 07/17/17  Wieters, Hallie C, PA-C  omeprazole (PRILOSEC) 40 MG capsule Take 1 capsule (40 mg total) by mouth daily. Patient not taking: Reported on 12/10/2014 06/05/14   Ozella Rocks, MD  ondansetron (ZOFRAN) 4 MG tablet Take 1 tablet (4 mg total) by mouth every 8 (eight) hours as needed for nausea or vomiting. Patient not taking:  Reported on 12/10/2014 06/05/14   Ozella Rocks, MD  penicillin v potassium (VEETID) 500 MG tablet Take 1 tablet (500 mg total) by mouth 4 (four) times daily for 7 days. 07/12/17 07/19/17  Emi Holes, PA-C    Family History Family History  Problem Relation Age of Onset  . Hypertension Mother   . Gout Father     Social History Social History   Tobacco Use  . Smoking status: Never Smoker  . Smokeless tobacco: Never Used  Substance Use Topics  . Alcohol use: No  . Drug use: No     Allergies   Patient has no known allergies.   Review of Systems Review of Systems  Constitutional: Negative for fever.  HENT: Positive for dental problem, mouth sores and sore throat (resolved). Negative for ear pain.      Physical Exam Updated Vital Signs BP 118/67 (BP Location: Right Arm)   Pulse 88   Temp 98.5 F (36.9 C) (Oral)   Resp 18   Ht 5\' 2"  (1.575 m)   LMP 06/28/2017 (Exact Date)   SpO2 100%   BMI 34.57 kg/m   Physical Exam  Constitutional: She appears well-developed and well-nourished. No distress.  HENT:  Head: Normocephalic and atraumatic.  Mouth/Throat: Oropharynx is clear and moist. Oral lesions present. No trismus in the jaw. Normal  dentition. Dental abscesses and dental caries present. No uvula swelling. No oropharyngeal exudate, posterior oropharyngeal edema, posterior oropharyngeal erythema or tonsillar abscesses. Tonsils are 0 on the right. Tonsils are 0 on the left. No tonsillar exudate.    Several white shallow ulcers with erythematous base especially in the right buccal space  Eyes: Pupils are equal, round, and reactive to light. Conjunctivae are normal. Right eye exhibits no discharge. Left eye exhibits no discharge. No scleral icterus.  Neck: Normal range of motion. Neck supple. Tracheal deviation: 1, mobile, palpable tender lymph node in the Right submandibular space. No thyromegaly present.  Cardiovascular: Normal rate, regular rhythm, normal heart  sounds and intact distal pulses. Exam reveals no gallop and no friction rub.  No murmur heard. Pulmonary/Chest: Effort normal and breath sounds normal. No stridor. No respiratory distress. She has no wheezes. She has no rales.  Abdominal: Soft. Bowel sounds are normal. She exhibits no distension. There is no tenderness. There is no rebound and no guarding.  Musculoskeletal: She exhibits no edema.  Lymphadenopathy:    She has cervical adenopathy.  Neurological: She is alert. Coordination normal.  Skin: Skin is warm and dry. No rash noted. She is not diaphoretic. No pallor.  Psychiatric: She has a normal mood and affect.  Nursing note and vitals reviewed.    ED Treatments / Results  Labs (all labs ordered are listed, but only abnormal results are displayed) Labs Reviewed - No data to display  EKG None  Radiology No results found.  Procedures Procedures (including critical care time)  Medications Ordered in ED Medications - No data to display   Initial Impression / Assessment and Plan / ED Course  I have reviewed the triage vital signs and the nursing notes.  Pertinent labs & imaging results that were available during my care of the patient were reviewed by me and considered in my medical decision making (see chart for details).     Patient with probable dental abscess.  Doubt Ludwig's angina or deep space infection.  Patient also with several probable aphthous ulcers which may be related to surrounding infection.  Could also be viral.  HSV culture and typing is pending at urgent care and patient is advised to follow-up with if this if she does not hear back from them.  She denies history of HSV or oral exposure, so will defer treatment for this at this time.  Will treat dental abscess with penicillin and ibuprofen and Tylenol to Magic mouthwash.  Patient advised to follow-up with dentist.  Return precautions discussed.  Patient understands and agrees with plan.  Patient vitals  stable throughout ED course and discharged in satisfactory condition.  Final Clinical Impressions(s) / ED Diagnoses   Final diagnoses:  Oral lesion  Dental abscess    ED Discharge Orders        Ordered    penicillin v potassium (VEETID) 500 MG tablet  4 times daily     07/12/17 1721    ibuprofen (ADVIL,MOTRIN) 600 MG tablet  Every 6 hours PRN     07/12/17 1721    acetaminophen (TYLENOL) 500 MG tablet  Every 6 hours PRN     07/12/17 1721       Ramirez Fullbright, Waylan Bogalexandra M, PA-C 07/12/17 1732    Rolland PorterJames, Mark, MD 07/14/17 0040

## 2017-07-14 ENCOUNTER — Telehealth (HOSPITAL_COMMUNITY): Payer: Self-pay

## 2017-07-14 NOTE — Telephone Encounter (Signed)
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 Educated on Herpes and safe sex practices. Pt denies any concerns and verbalized understanding.  

## 2018-07-02 ENCOUNTER — Other Ambulatory Visit: Payer: Self-pay

## 2018-07-02 ENCOUNTER — Emergency Department (HOSPITAL_COMMUNITY)
Admission: EM | Admit: 2018-07-02 | Discharge: 2018-07-03 | Disposition: A | Discharge status: Other Acute Inpatient Hospital | Payer: Self-pay | Attending: Emergency Medicine | Admitting: Emergency Medicine

## 2018-07-02 ENCOUNTER — Encounter (HOSPITAL_COMMUNITY): Payer: Self-pay | Admitting: Emergency Medicine

## 2018-07-02 DIAGNOSIS — Z7289 Other problems related to lifestyle: Secondary | ICD-10-CM

## 2018-07-02 DIAGNOSIS — R45851 Suicidal ideations: Secondary | ICD-10-CM | POA: Insufficient documentation

## 2018-07-02 NOTE — ED Triage Notes (Signed)
Pt AOX3. PT DENIES ANY PAIN OR DISCOMFORT. FAMILY TRANSPORTED PT TO ED. PT REPORTED FEELING SUICIDAL WITH A PLAN TO TAKE PILLS. DENIES hi AND AVH AT THIS TIME. PT REPORTS THESE THOUGHTS HAS COME AND GO OVER THE PAST 8 YEARS.

## 2018-07-03 LAB — RAPID URINE DRUG SCREEN, HOSP PERFORMED
Amphetamines: NOT DETECTED
Barbiturates: NOT DETECTED
Benzodiazepines: NOT DETECTED
Cocaine: NOT DETECTED
Opiates: NOT DETECTED
Tetrahydrocannabinol: NOT DETECTED

## 2018-07-03 NOTE — ED Notes (Signed)
Pt discharged home with family. Pt verbalizes an understanding of discharge instructions. Pt stable and safe.

## 2018-07-03 NOTE — ED Provider Notes (Signed)
[place] DEPT Provider Note  CSN: [id number] Arrival date & time: 07/02/18 2243  Chief Complaint(s) Suicidal  HPI Martha Lopez is a 28 y.o. female   The history is provided by the patient.  Mental Health Problem Presenting symptoms: self-mutilation and suicidal thoughts   Presenting symptoms: no delusions, no depression, no homicidal ideas, no paranoid behavior, no suicidal threats and no suicide attempt   Patient accompanied by:  Family member Degree of incapacity (severity):  Moderate Timing:  Sporadic Progression:  Waxing and waning Context: stressful life event (verbal altercation with friends)   Context: not alcohol use, not drug abuse and not medication   Treatment compliance:  Untreated Associated symptoms: no anhedonia, no appetite change, no chest pain, no decreased need for sleep, no fatigue, no feelings of worthlessness, no insomnia, no irritability and no poor judgment     Past Medical History History reviewed. No pertinent past medical history. Patient Active Problem List   Diagnosis Date Noted  . Fall down stairs 02/26/2012  . Contusion of back(922.31) 02/26/2012   Home Medication(s) Prior to Admission medications   Medication Sig Start Date End Date Taking? Authorizing Provider  acetaminophen (TYLENOL) 500 MG tablet Take 1 tablet (500 mg total) by mouth every 6 (six) hours as needed. Patient not taking: Reported on 07/03/2018 07/12/17   Frederica Kuster, PA-C  cyclobenzaprine (FLEXERIL) 5 MG tablet Take 1 tablet (5 mg total) by mouth 3 (three) times daily as needed for muscle spasms. Patient not taking: Reported on 07/03/2018 10/22/15   Ashley Murrain, NP  ibuprofen (ADVIL,MOTRIN) 600 MG tablet Take 1 tablet (600 mg total) by mouth every 6 (six) hours as needed. Patient not taking: Reported on 07/03/2018 07/12/17   Frederica Kuster, PA-C  omeprazole (PRILOSEC) 40 MG capsule Take 1 capsule (40 mg total) by mouth daily. Patient not  taking: Reported on 12/10/2014 06/05/14   Waldemar Dickens, MD  ondansetron (ZOFRAN) 4 MG tablet Take 1 tablet (4 mg total) by mouth every 8 (eight) hours as needed for nausea or vomiting. Patient not taking: Reported on 12/10/2014 06/05/14   Waldemar Dickens, MD                                                                                                                                    Past Surgical History History reviewed. No pertinent surgical history. Family History Family History  Problem Relation Age of Onset  . Hypertension Mother   . Gout Father     Social History Social History   Tobacco Use  . Smoking status: Never Smoker  . Smokeless tobacco: Never Used  Substance Use Topics  . Alcohol use: No  . Drug use: No   Allergies Patient has no known allergies.  Review of Systems Review of Systems  Constitutional: Negative for appetite change, fatigue and irritability.  Cardiovascular: Negative for chest pain.  Psychiatric/Behavioral: Positive for  self-injury and suicidal ideas. Negative for homicidal ideas and paranoia. The patient does not have insomnia.    All other systems are reviewed and are negative for acute change except as noted in the HPI  Physical Exam Vital Signs  I have reviewed the triage vital signs BP (!) 145/91 (BP Location: Left Arm)   Pulse 85   Temp 98.9 F (37.2 C) (Oral)   Resp 17   LMP 07/01/2018 (Exact Date)   SpO2 96%   Physical Exam Vitals signs reviewed.  Constitutional:      General: She is not in acute distress.    Appearance: She is well-developed. She is not diaphoretic.  HENT:     Head: Normocephalic and atraumatic.     Nose: Nose normal.  Eyes:     General: No scleral icterus.       Right eye: No discharge.        Left eye: No discharge.     Conjunctiva/sclera: Conjunctivae normal.     Pupils: Pupils are equal, round, and reactive to light.  Neck:     Musculoskeletal: Normal range of motion and neck supple.   Cardiovascular:     Rate and Rhythm: Normal rate and regular rhythm.     Heart sounds: No murmur. No friction rub. No gallop.   Pulmonary:     Effort: Pulmonary effort is normal. No respiratory distress.     Breath sounds: Normal breath sounds. No stridor. No rales.  Abdominal:     General: There is no distension.     Palpations: Abdomen is soft.     Tenderness: There is no abdominal tenderness.  Musculoskeletal:        General: No tenderness.  Skin:    General: Skin is warm and dry.     Findings: No erythema or rash.       Neurological:     Mental Status: She is alert and oriented to person, place, and time.     ED Results and Treatments Labs (all labs ordered are listed, but only abnormal results are displayed) Labs Reviewed  RAPID URINE DRUG SCREEN, HOSP PERFORMED                                                                                                                         EKG  EKG Interpretation  Date/Time:    Ventricular Rate:    PR Interval:    QRS Duration:   QT Interval:    QTC Calculation:   R Axis:     Text Interpretation:        Radiology No results found. Pertinent labs & imaging results that were available during my care of the patient were reviewed by me and considered in my medical decision making (see chart for details).  Medications Ordered in ED Medications - No data to display  Procedures Procedures  (including critical care time)  Medical Decision Making / ED Course I have reviewed the nursing notes for this encounter and the patient's prior records (if available in EHR or on provided paperwork).    Self mutilation to deal with stress. No active SI or plan. No HI or AVH. Asking for resources.   Patient is not an immediate threat to herself or others at this time.  Will provide patient with outpatient  resources.  The patient appears reasonably screened and/or stabilized for discharge and I doubt any other medical condition or other [facility] requiring further screening, evaluation, or treatment in the ED at this time prior to discharge.   Final Clinical Impression(s) / ED Diagnoses Final diagnoses:  Self-mutilation    Disposition: Discharge  Condition: Good  I have discussed the results, Dx and Tx plan with the patient who expressed understanding and agree(s) with the plan. Discharge instructions discussed at great length. The patient was given strict return precautions who verbalized understanding of the instructions. No further questions at time of discharge.    ED Discharge Orders    None       Follow Up: [address] [address] [place] [id number] [phone]  Go to       This chart was dictated using voice recognition software.  Despite best efforts to proofread,  errors can occur which can change the documentation meaning.   Nira Connardama, Pedro Eduardo, MD 07/03/18 [phone]

## 2018-10-22 ENCOUNTER — Other Ambulatory Visit: Payer: Self-pay

## 2018-10-22 ENCOUNTER — Encounter (HOSPITAL_COMMUNITY): Payer: Self-pay | Admitting: *Deleted

## 2018-10-22 ENCOUNTER — Emergency Department (HOSPITAL_COMMUNITY)
Admission: EM | Admit: 2018-10-22 | Discharge: 2018-10-22 | Disposition: A | Discharge status: Home / Self Care | Payer: Self-pay | Attending: Emergency Medicine | Admitting: Emergency Medicine

## 2018-10-22 DIAGNOSIS — R519 Headache, unspecified: Secondary | ICD-10-CM | POA: Insufficient documentation

## 2018-10-22 DIAGNOSIS — R300 Dysuria: Secondary | ICD-10-CM | POA: Insufficient documentation

## 2018-10-22 HISTORY — DX: Epistaxis: R04.0

## 2018-10-22 HISTORY — DX: Migraine, unspecified, not intractable, without status migrainosus: G43.909

## 2018-10-22 LAB — URINALYSIS, ROUTINE W REFLEX MICROSCOPIC
Bacteria, UA: NONE SEEN
Bilirubin Urine: NEGATIVE
Glucose, UA: >=500 mg/dL — AB
Hgb urine dipstick: NEGATIVE
Ketones, ur: 20 mg/dL — AB
Leukocytes,Ua: NEGATIVE
Nitrite: NEGATIVE
Protein, ur: NEGATIVE mg/dL
Specific Gravity, Urine: 1.022 (ref 1.005–1.030)
pH: 7 (ref 5.0–8.0)

## 2018-10-22 LAB — I-STAT CHEM 8, ED
BUN: 12 mg/dL (ref 6–20)
Calcium, Ion: 1.24 mmol/L (ref 1.15–1.40)
Chloride: 97 mmol/L — ABNORMAL LOW (ref 98–111)
Creatinine, Ser: 0.7 mg/dL (ref 0.44–1.00)
Glucose, Bld: 284 mg/dL — ABNORMAL HIGH (ref 70–99)
HCT: 42 % (ref 36.0–46.0)
Hemoglobin: 14.3 g/dL (ref 12.0–15.0)
Potassium: 4.5 mmol/L (ref 3.5–5.1)
Sodium: 136 mmol/L (ref 135–145)
TCO2: 28 mmol/L (ref 22–32)

## 2018-10-22 LAB — POC URINE PREG, ED: Preg Test, Ur: NEGATIVE

## 2018-10-22 NOTE — ED Provider Notes (Signed)
[place] EMERGENCY DEPARTMENT Provider Note   CSN: [id number] Arrival date & time: 10/22/18  1113     History   Chief Complaint Chief Complaint  Patient presents with  . Migraine  . Epistaxis    HPI Martha Lopez is a 28 y.o. female with 4-day history of dysuria, intermittent headaches, suprapubic discomfort, constipation, and occasional epistaxis.  She states that the past 2 days she is also experienced increased urinary urgency and frequency as well as some right-sided ear discomfort.  The headaches that she describes are bilateral and not associated with any nausea, vomiting, aura, or photophobia.  She denies any fevers, chills, back pain, cough, runny nose, congestion, sore throat, loss of smell or taste, vaginal discharge, or shortness of breath.  Denies any sick contacts.     HPI  Past Medical History:  Diagnosis Date  . Epistaxis   . Migraines     Patient Active Problem List   Diagnosis Date Noted  . Fall down stairs 02/26/2012  . Contusion of back(922.31) 02/26/2012    History reviewed. No pertinent surgical history.   OB History    Gravida  0   Para  0   Term  0   Preterm  0   AB  0   Living  0     SAB  0   TAB  0   Ectopic  0   Multiple  0   Live Births  0            Home Medications    Prior to Admission medications   Not on File    Family History Family History  Problem Relation Age of Onset  . Hypertension Mother   . Gout Father     Social History Social History   Tobacco Use  . Smoking status: Never Smoker  . Smokeless tobacco: Never Used  Substance Use Topics  . Alcohol use: No  . Drug use: No     Allergies   Patient has no known allergies.   Review of Systems Review of Systems  All other systems reviewed and are negative.    Physical Exam Updated Vital Signs BP (!) 145/83 (BP Location: Left Arm)   Pulse 86   Temp 98.3 F (36.8 C) (Oral)   Resp 16   Ht 5\' 3"  (1.6 m)   Wt  85.7 kg   LMP 10/14/2018   SpO2 100%   BMI 33.47 kg/m   Physical Exam Vitals signs and nursing note reviewed. Exam conducted with a chaperone present.  Constitutional:      Appearance: Normal appearance.  HENT:     Head: Normocephalic and atraumatic.     Ears:     Comments: Canals are erythematous bilaterally.  No swelling or cerumen impaction.  TMs are clear bilaterally.    Nose: Nose normal.  Eyes:     General: No scleral icterus.    Conjunctiva/sclera: Conjunctivae normal.  Neck:     Musculoskeletal: Normal range of motion.  Cardiovascular:     Rate and Rhythm: Normal rate and regular rhythm.     Pulses: Normal pulses.     Heart sounds: Normal heart sounds.  Pulmonary:     Effort: Pulmonary effort is normal. No respiratory distress.     Breath sounds: Normal breath sounds.  Abdominal:     Comments: Abdomen soft, nondistended.  Suprapubic tenderness to palpation.  No guarding.  Rest of abdomen is nontender.  Bowel sounds normoactive.  No  masses appreciated.  Skin:    General: Skin is dry.  Neurological:     Mental Status: She is alert.     GCS: GCS eye subscore is 4. GCS verbal subscore is 5. GCS motor subscore is 6.  Psychiatric:        Mood and Affect: Mood normal.        Behavior: Behavior normal.        Thought Content: Thought content normal.      ED Treatments / Results  Labs (all labs ordered are listed, but only abnormal results are displayed) Labs Reviewed  URINALYSIS, ROUTINE W REFLEX MICROSCOPIC - Abnormal; Notable for the following components:      Result Value   APPearance HAZY (*)    Glucose, UA >=500 (*)    Ketones, ur 20 (*)    All other components within normal limits  I-STAT CHEM 8, ED - Abnormal; Notable for the following components:   Chloride 97 (*)    Glucose, Bld 284 (*)    All other components within normal limits  POC URINE PREG, ED    EKG None  Radiology No results found.  Procedures Procedures (including critical care  time)  Medications Ordered in ED Medications - No data to display   Initial Impression / Assessment and Plan / ED Course  I have reviewed the triage vital signs and the nursing notes.  Pertinent labs & imaging results that were available during my care of the patient were reviewed by me and considered in my medical decision making (see chart for details).  Clinical Course as of Oct 21 1413  Mon Oct 22, 2018  1358 Glucose(!): 284 [GG]  1409 Glucose, UA(!): >=500 [GG]    Clinical Course User Index [GG] Lorelee New, PA-C       Advised patient to discontinue use of Q-tips as her canals are irritated bilaterally.  Encouraged patient to use MiraLAX as well as prune juice to help relieve her 3-day history of constipation.  She still endorses flatulence and her bowel sounds are normoactive so I am not concerned for an obstruction at this time.  Encourage patient to cycle Tylenol and ibuprofen or Excedrin for symptomatic relief of her headache.  Encouraged increased oral hydration.  She denies any thunderclap presentation, neurologic symptoms, blurred vision, head trauma, or any other history that would make me concerned for intracranial bleed.  Patient's history of dysuria, increased urinary frequency, and suprapubic tenderness elicit a concern for an acute cystitis.  However, her UA did not reveal any RBCs, leukocytes, bacteria, or nitrates. Instead, it demonstrated >500 glucose.   Glucosuria prompted me to obtain I-Stat which demonstrated blood glucose of 284.  Emphasized importance of getting established with a PCP to address her prediabetic or diabetic status.  Explained that her glucosuria could be causing her urinary discomfort and increased frequency.   All of the evaluation and work-up results were discussed with the patient. They were provided opportunity to ask any additional questions and have none at this time. They have expressed understanding of verbal discharge  instructions as well as return precautions and are agreeable to the plan.     Final Clinical Impressions(s) / ED Diagnoses   Final diagnoses:  Dysuria    ED Discharge Orders    None       Lorelee New, PA-C 10/22/18 1415    Pricilla Loveless, MD 10/23/18 [phone]

## 2018-10-22 NOTE — Discharge Instructions (Addendum)
You had elevated blood sugars as well as sugar in your urine.  Suspect that you are either prediabetic or diabetic and encourage you to get established with a PCP ASAP for further evaluation including but not limited to an A1c.  Please call the suggested clinic, [facility].  Color, Urine YELLOW  Appearance HAZY  Specific Gravity, Urine 1.022  pH 7.0  Glucose, UA >=500   Sodium 136  Potassium 4.5  Chloride 97  BUN 12  Creatinine 0.70  Glucose 284  Calcium Ionized 1.24  TCO2 28  Hemoglobin 14.3

## 2018-10-22 NOTE — ED Triage Notes (Signed)
Pt states migraines for several days with epistaxis (bleeding controlled presently).  Pt also c/o burning with urination.

## 2018-10-29 ENCOUNTER — Encounter (HOSPITAL_COMMUNITY): Payer: Self-pay | Admitting: *Deleted

## 2018-10-29 ENCOUNTER — Emergency Department (HOSPITAL_COMMUNITY)
Admission: EM | Admit: 2018-10-29 | Discharge: 2018-10-29 | Disposition: A | Discharge status: Home / Self Care | Payer: Self-pay | Attending: Emergency Medicine | Admitting: Emergency Medicine

## 2018-10-29 ENCOUNTER — Other Ambulatory Visit: Payer: Self-pay

## 2018-10-29 DIAGNOSIS — R1030 Lower abdominal pain, unspecified: Secondary | ICD-10-CM | POA: Insufficient documentation

## 2018-10-29 DIAGNOSIS — Z5321 Procedure and treatment not carried out due to patient leaving prior to being seen by health care provider: Secondary | ICD-10-CM | POA: Insufficient documentation

## 2018-10-29 HISTORY — DX: Anxiety disorder, unspecified: F41.9

## 2018-10-29 HISTORY — DX: Depression, unspecified: F32.A

## 2018-10-29 LAB — COMPREHENSIVE METABOLIC PANEL
ALT: 19 U/L (ref 0–44)
AST: 20 U/L (ref 15–41)
Albumin: 4 g/dL (ref 3.5–5.0)
Alkaline Phosphatase: 99 U/L (ref 38–126)
Anion gap: 12 (ref 5–15)
BUN: 10 mg/dL (ref 6–20)
CO2: 26 mmol/L (ref 22–32)
Calcium: 9.3 mg/dL (ref 8.9–10.3)
Chloride: 98 mmol/L (ref 98–111)
Creatinine, Ser: 0.79 mg/dL (ref 0.44–1.00)
GFR calc Af Amer: >60 mL/min (ref >60)
GFR calc non Af Amer: >60 mL/min (ref >60)
Glucose, Bld: 295 mg/dL — ABNORMAL HIGH (ref 70–99)
Potassium: 3.9 mmol/L (ref 3.5–5.1)
Sodium: 136 mmol/L (ref 135–145)
Total Bilirubin: 0.6 mg/dL (ref 0.3–1.2)
Total Protein: 7.8 g/dL (ref 6.5–8.1)

## 2018-10-29 LAB — URINALYSIS, ROUTINE W REFLEX MICROSCOPIC
Bilirubin Urine: NEGATIVE
Glucose, UA: >=500 mg/dL — AB
Ketones, ur: NEGATIVE mg/dL
Leukocytes,Ua: NEGATIVE
Nitrite: NEGATIVE
Protein, ur: NEGATIVE mg/dL
Specific Gravity, Urine: 1.03 (ref 1.005–1.030)
pH: 5 (ref 5.0–8.0)

## 2018-10-29 LAB — CBC
HCT: 42.3 % (ref 36.0–46.0)
Hemoglobin: 13.4 g/dL (ref 12.0–15.0)
MCH: 26.2 pg (ref 26.0–34.0)
MCHC: 31.7 g/dL (ref 30.0–36.0)
MCV: 82.6 fL (ref 80.0–100.0)
Platelets: 366 10*3/uL (ref 150–400)
RBC: 5.12 MIL/uL — ABNORMAL HIGH (ref 3.87–5.11)
RDW: 13 % (ref 11.5–15.5)
WBC: 11.5 10*3/uL — ABNORMAL HIGH (ref 4.0–10.5)
nRBC: 0 % (ref 0.0–0.2)

## 2018-10-29 LAB — I-STAT BETA HCG BLOOD, ED (MC, WL, AP ONLY): I-stat hCG, quantitative: <5 m[IU]/mL (ref <5)

## 2018-10-29 MED ORDER — SODIUM CHLORIDE 0.9% FLUSH: 3.0000 mL | Freq: Once | INTRAVENOUS | Status: DC

## 2018-10-29 NOTE — ED Triage Notes (Signed)
Pt arrives with c/o lower abdominal pain, blood in her urine, and headache. She had 3 episodes of vomiting. Denies fevers, vag bleeding/discharge or painful urination.

## 2018-11-02 ENCOUNTER — Encounter (HOSPITAL_COMMUNITY): Payer: Self-pay | Admitting: Emergency Medicine

## 2018-11-02 ENCOUNTER — Other Ambulatory Visit: Payer: Self-pay

## 2018-11-02 ENCOUNTER — Emergency Department (HOSPITAL_COMMUNITY)
Admission: EM | Admit: 2018-11-02 | Discharge: 2018-11-02 | Disposition: A | Discharge status: Home / Self Care | Payer: Self-pay | Attending: Emergency Medicine | Admitting: Emergency Medicine

## 2018-11-02 DIAGNOSIS — L7632 Postprocedural hematoma of skin and subcutaneous tissue following other procedure: Secondary | ICD-10-CM | POA: Insufficient documentation

## 2018-11-02 DIAGNOSIS — S40022A Contusion of left upper arm, initial encounter: Secondary | ICD-10-CM

## 2018-11-02 NOTE — ED Notes (Signed)
Patient Alert and oriented to baseline. Stable and ambulatory to baseline. Patient verbalized understanding of the discharge instructions.  Patient belongings were taken by the patient.   

## 2018-11-02 NOTE — Discharge Instructions (Addendum)
Your wound today measures 6 cm, please have this reevaluated if you do not see improvement within the next 5 days.  You may continue to apply ice or heat to the area and massage which will help with reducing bruising size.

## 2018-11-02 NOTE — ED Provider Notes (Signed)
[facility] EMERGENCY DEPARTMENT Provider Note   CSN: [id number] Arrival date & time: 11/02/18  1425     History   Chief Complaint Chief Complaint  Patient presents with  . Arm Pain    HPI Martha Lopez is a 28 y.o. female.     28 y.o female with a PMH of Depression, Anxiety presents to the ED with a chief complaint of left arm bruising.  Patient was evaluated in the ED 5 days ago, she had a tourniquet placed on her left arm above her AC, this tourniquet unfortunately was left on for a total of 5 minutes while patient waited in the waiting room.  She reports bruising began after her discharge from hospital.  She reports the swelling has decreased however color changes continue.  She reports some pain with palpation, has been applying heat to the area but not taking any medication for improvement in symptoms.  She denies any swelling to her left arm, prior history of blood clots, any other clotting disorder.  The history is provided by the patient.  Arm Pain    Past Medical History:  Diagnosis Date  . Anxiety   . Depression   . Epistaxis   . Migraines     Patient Active Problem List   Diagnosis Date Noted  . Fall down stairs 02/26/2012  . Contusion of back(922.31) 02/26/2012    History reviewed. No pertinent surgical history.   OB History    Gravida  0   Para  0   Term  0   Preterm  0   AB  0   Living  0     SAB  0   TAB  0   Ectopic  0   Multiple  0   Live Births  0            Home Medications    Prior to Admission medications   Not on File    Family History Family History  Problem Relation Age of Onset  . Hypertension Mother   . Gout Father     Social History Social History   Tobacco Use  . Smoking status: Never Smoker  . Smokeless tobacco: Never Used  Substance Use Topics  . Alcohol use: No  . Drug use: No     Allergies   Patient has no known allergies.   Review of Systems Review of Systems   Constitutional: Negative for fever.  Musculoskeletal: Positive for myalgias.  Skin: Positive for color change.     Physical Exam Updated Vital Signs BP 138/87 (BP Location: Right Arm)   Pulse 86   Temp 97.8 F (36.6 C) (Oral)   Resp 18   LMP 10/14/2018   SpO2 98%   Physical Exam Vitals signs and nursing note reviewed.  Constitutional:      Appearance: Normal appearance. She is not ill-appearing.  HENT:     Head: Normocephalic and atraumatic.     Nose: Nose normal.  Eyes:     Pupils: Pupils are equal, round, and reactive to light.  Neck:     Musculoskeletal: Normal range of motion and neck supple.  Cardiovascular:     Rate and Rhythm: Normal rate.  Pulmonary:     Effort: Pulmonary effort is normal.     Breath sounds: Normal breath sounds. No wheezing.  Abdominal:     General: Abdomen is flat.  Musculoskeletal:       Arms:     Comments: Left arm  bruising noted, please see pictures for details.  Pulses present, capillary refill is intact.  Full range of motion without pain.  Skin:    General: Skin is warm and dry.  Neurological:     Mental Status: She is alert and oriented to person, place, and time.        ED Treatments / Results  Labs (all labs ordered are listed, but only abnormal results are displayed) Labs Reviewed - No data to display  EKG None  Radiology No results found.  Procedures Procedures (including critical care time)  Medications Ordered in ED Medications - No data to display   Initial Impression / Assessment and Plan / ED Course  I have reviewed the triage vital signs and the nursing notes.  Pertinent labs & imaging results that were available during my care of the patient were reviewed by me and considered in my medical decision making (see chart for details).       Patient with no past medical history presents to the ED with complaints of left AC bruising since Sunday.  Patient had blood drawn while in the ED, reports a  tourniquet was left on for approximately 5 minutes, reports upon removal there was a noted bruise, some suspicion for likely an infiltration.  Wound appears to have some color changes, no swelling noted on my exam, no swelling to the distal or proximal area.  She is neurovascularly intact, pulses remain present.  Patient has been applying ice and heat to the area which has helped some with dissipation and swelling however extending bruising continues.  Advised that today on her visit wound was measuring approximately 6 cm, if she did not notice any improvement she would likely need to re-visit the ED along with be evaluated for worsening symptoms.  She denies any trauma, prior history of clotting disorders, no prior history of hemophilia's.  Patient understands and agrees with management, return precautions provided.  Patient stable for discharge.  Portions of this note were generated with Scientist, clinical (histocompatibility and immunogenetics). Dictation errors may occur despite best attempts at proofreading.   Final Clinical Impressions(s) / ED Diagnoses   Final diagnoses:  Superficial bruising of arm, left, initial encounter    ED Discharge Orders    None       Claude Manges, PA-C 11/02/18 1549    Margarita Grizzle, MD 11/05/18 1244

## 2018-11-02 NOTE — ED Triage Notes (Signed)
States had labs drawn on Sunday and they left tourniquit in place and now her left [facility] has a huge bruise and her arm hurt s, has good pulse aND CAN WIGGLE FINGERS COLOR GOOD TO EXTREMITY < 3 SEC CAP REFILL

## 2018-11-30 ENCOUNTER — Inpatient Hospital Stay: Payer: Self-pay | Admitting: Family Medicine

## 2018-12-28 ENCOUNTER — Other Ambulatory Visit: Payer: Self-pay

## 2018-12-28 DIAGNOSIS — Z20822 Contact with and (suspected) exposure to covid-19: Secondary | ICD-10-CM

## 2018-12-31 LAB — NOVEL CORONAVIRUS, NAA: SARS-CoV-2, NAA: NOT DETECTED

## 2019-01-31 ENCOUNTER — Ambulatory Visit: Payer: 59 | Attending: Internal Medicine

## 2019-01-31 DIAGNOSIS — Z20822 Contact with and (suspected) exposure to covid-19: Secondary | ICD-10-CM | POA: Insufficient documentation

## 2019-02-01 LAB — NOVEL CORONAVIRUS, NAA: SARS-CoV-2, NAA: NOT DETECTED

## 2019-03-21 ENCOUNTER — Ambulatory Visit: Payer: 59

## 2019-04-01 ENCOUNTER — Ambulatory Visit: Payer: 59 | Attending: Internal Medicine

## 2019-04-15 ENCOUNTER — Ambulatory Visit: Payer: 59 | Admitting: Registered"

## 2019-04-17 ENCOUNTER — Ambulatory Visit: Payer: 59

## 2019-06-13 ENCOUNTER — Other Ambulatory Visit: Payer: Self-pay | Admitting: Orthopedic Surgery

## 2019-06-13 DIAGNOSIS — M25511 Pain in right shoulder: Secondary | ICD-10-CM

## 2019-06-26 ENCOUNTER — Ambulatory Visit
Admission: RE | Admit: 2019-06-26 | Discharge: 2019-06-26 | Disposition: A | Discharge status: Home / Self Care | Payer: 59 | Source: Ambulatory Visit | Attending: General Surgery | Admitting: General Surgery

## 2019-06-26 ENCOUNTER — Other Ambulatory Visit: Payer: Self-pay | Admitting: General Surgery

## 2019-06-26 DIAGNOSIS — M7989 Other specified soft tissue disorders: Secondary | ICD-10-CM

## 2019-07-04 ENCOUNTER — Other Ambulatory Visit: Payer: Self-pay

## 2019-07-04 ENCOUNTER — Telehealth (HOSPITAL_COMMUNITY): Payer: 59 | Admitting: Psychiatric/Mental Health

## 2019-07-08 ENCOUNTER — Other Ambulatory Visit (HOSPITAL_COMMUNITY): Payer: 59

## 2019-07-11 ENCOUNTER — Emergency Department (HOSPITAL_COMMUNITY)
Admission: EM | Admit: 2019-07-11 | Discharge: 2019-07-11 | Disposition: A | Discharge status: Home / Self Care | Payer: 59 | Attending: Emergency Medicine | Admitting: Emergency Medicine

## 2019-07-11 ENCOUNTER — Other Ambulatory Visit: Payer: Self-pay

## 2019-07-11 ENCOUNTER — Encounter (HOSPITAL_COMMUNITY): Payer: Self-pay | Admitting: *Deleted

## 2019-07-11 ENCOUNTER — Encounter (HOSPITAL_BASED_OUTPATIENT_CLINIC_OR_DEPARTMENT_OTHER): Admission: RE | Payer: Self-pay | Source: Home / Self Care

## 2019-07-11 ENCOUNTER — Ambulatory Visit (HOSPITAL_BASED_OUTPATIENT_CLINIC_OR_DEPARTMENT_OTHER): Admission: RE | Admit: 2019-07-11 | Payer: 59 | Source: Home / Self Care | Admitting: General Surgery

## 2019-07-11 DIAGNOSIS — M79652 Pain in left thigh: Secondary | ICD-10-CM | POA: Insufficient documentation

## 2019-07-11 DIAGNOSIS — R221 Localized swelling, mass and lump, neck: Secondary | ICD-10-CM | POA: Diagnosis present

## 2019-07-11 DIAGNOSIS — M542 Cervicalgia: Secondary | ICD-10-CM | POA: Diagnosis not present

## 2019-07-11 DIAGNOSIS — E119 Type 2 diabetes mellitus without complications: Secondary | ICD-10-CM | POA: Diagnosis not present

## 2019-07-11 DIAGNOSIS — Z79899 Other long term (current) drug therapy: Secondary | ICD-10-CM | POA: Insufficient documentation

## 2019-07-11 DIAGNOSIS — Z7984 Long term (current) use of oral hypoglycemic drugs: Secondary | ICD-10-CM | POA: Insufficient documentation

## 2019-07-11 HISTORY — DX: Type 2 diabetes mellitus without complications: E11.9

## 2019-07-11 SURGERY — EXCISION MASS
Anesthesia: Monitor Anesthesia Care | Laterality: Left

## 2019-07-11 NOTE — ED Provider Notes (Signed)
[facility] EMERGENCY DEPARTMENT Provider Note   CSN: [id number] Arrival date & time: 07/11/19  1046     History Chief Complaint  Patient presents with  . Leg Pain  . Arm Pain    Martha Lopez is a 29 y.o. female.  HPI   Patient presents to the emergency department with chief complaint of left mass on her clavicle.  Patient states she first noted this about 1 month ago and  recently she started to feel some slight pain from it.  Patient states pain come sporadically, there appears to be no alleviating or aggravating factors.  She denies  numbness or tingling in her arms or hand, no associated weakness, no rashes, drainage noted.  Patient denies any sort of recent trauma to the area and thinks it could possibly be a lipoma.  She also complains of left leg pain that just started today.  She denies recent trauma to it, no numbness or tingling felt in her feet, no difficulty walking, no bruising or other abnormalities noted.  Patient does not have any significant medical history, and is not taking any medication on daily basis.  Patient denies headache, fever, chills, shortness of breath, chest pain, abdominal pain, urinary symptoms, diarrhea, leg pain.  Past Medical History:  Diagnosis Date  . Anxiety   . Anxiety   . Depression   . Depression   . Diabetes mellitus without complication (HCC)   . Epistaxis   . Migraines     Patient Active Problem List   Diagnosis Date Noted  . Fall down stairs 02/26/2012  . Contusion of back(922.31) 02/26/2012    History reviewed. No pertinent surgical history.   OB History    Gravida  0   Para  0   Term  0   Preterm  0   AB  0   Living  0     SAB  0   TAB  0   Ectopic  0   Multiple  0   Live Births  0           Family History  Problem Relation Age of Onset  . Hypertension Mother   . Gout Father     Social History   Tobacco Use  . Smoking status: Never Smoker  . Smokeless tobacco: Never  Used  Vaping Use  . Vaping Use: Never used  Substance Use Topics  . Alcohol use: No  . Drug use: No    Home Medications Prior to Admission medications   Medication Sig Start Date End Date Taking? Authorizing Provider  acetaminophen (TYLENOL) 500 MG tablet Take 1,000 mg by mouth every 6 (six) hours as needed for mild pain.   Yes [provider]  escitalopram (LEXAPRO) 10 MG tablet Take 15 mg by mouth daily. 01/24/19  Yes [provider]  ibuprofen (ADVIL) 200 MG tablet Take 400 mg by mouth every 6 (six) hours as needed for mild pain.   Yes [provider]  metFORMIN (GLUCOPHAGE-XR) 750 MG 24 hr tablet Take 750 mg by mouth 2 (two) times daily. 07/05/19  Yes [provider]  OZEMPIC, 0.25 OR 0.5 MG/DOSE, 2 MG/1.5ML SOPN Inject 1 mg into the skin every Monday. 04/24/19  Yes [provider]  tiZANidine (ZANAFLEX) 2 MG tablet Take 2 mg by mouth every 8 (eight) hours as needed for muscle spasms. 06/12/19  Yes [provider]  traZODone (DESYREL) 50 MG tablet Take 50-100 mg by mouth at bedtime as  needed for sleep. 06/13/19  Yes [provider]    Allergies    Patient has no known allergies.  Review of Systems   Review of Systems  Constitutional: Negative for chills and fever.  HENT: Negative for congestion, sore throat, tinnitus, trouble swallowing and voice change.   Eyes: Negative for pain and redness.  Respiratory: Negative for cough and shortness of breath.   Cardiovascular: Negative for chest pain.  Gastrointestinal: Negative for abdominal pain, nausea and vomiting.  Genitourinary: Negative for dyspareunia, dysuria, enuresis, flank pain, pelvic pain, vaginal bleeding and vaginal discharge.  Musculoskeletal: Negative for back pain and joint swelling.       Admits to left shoulder pain and left thigh pain.  Skin: Negative for rash.  Neurological: Negative for dizziness, light-headedness and headaches.  Hematological: Does not  bruise/bleed easily.    Physical Exam Updated Vital Signs BP 128/77 (BP Location: Right Arm)   Pulse 82   Temp 98.4 F (36.9 C) (Oral)   Resp 16   LMP 06/27/2019   SpO2 100%   Physical Exam Vitals and nursing note reviewed.  Constitutional:      General: She is not in acute distress.    Appearance: She is not ill-appearing.  HENT:     Head: Normocephalic and atraumatic.     Nose: No congestion.     Mouth/Throat:     Mouth: Mucous membranes are moist.     Pharynx: Oropharynx is clear.  Eyes:     General: No scleral icterus.    Pupils: Pupils are equal, round, and reactive to light.  Neck:     Comments: Neck was evaluated, no gross abnormalities noted, no lesions, erythema, swelling noted.  Patient's clavicle was visualized no gross abnormalities seen, no swelling, no lesion or erythema noted.  No tenderness to palpation, no mass felt. Cardiovascular:     Rate and Rhythm: Normal rate and regular rhythm.     Pulses: Normal pulses.     Heart sounds: No murmur heard.  No friction rub. No gallop.   Pulmonary:     Effort: No respiratory distress.     Breath sounds: No wheezing, rhonchi or rales.  Abdominal:     General: There is no distension.     Palpations: Abdomen is soft.     Tenderness: There is no abdominal tenderness. There is no guarding.  Musculoskeletal:        General: No swelling.     Comments: Patient's left thigh was visualized, no ecchymosis, lacerations, abrasions or other gross abnormalities noted. Patient was nontender to the touch, no swelling noted had good range of motion, 5 5 strength, good pedal pulses, good capillary refill, sensory  fully intact.  Skin:    General: Skin is warm and dry.     Capillary Refill: Capillary refill takes less than 2 seconds.     Findings: No rash.  Neurological:     Mental Status: She is alert and oriented to person, place, and time.  Psychiatric:        Mood and Affect: Mood normal.     ED Results / Procedures /  Treatments   Labs (all labs ordered are listed, but only abnormal results are displayed) Labs Reviewed - No data to display  EKG None  Radiology No results found.  Procedures Procedures (including critical care time)  Medications Ordered in ED Medications - No data to display  ED Course  I have reviewed the triage vital signs and the nursing notes.  Pertinent labs & imaging results that were available during my care of the patient were reviewed by me and considered in my medical decision making (see chart for details).    MDM Rules/Calculators/A&P                          I have personally reviewed all imaging, labs and have interpreted them.  Due to patient presentation most concerning for malignancy versus lipoma versus fracture. Unlikely patient suffering from malignancy as she has no symptoms, no night sweats, no unexplained weight loss or weight gain, no back pain, no history of cancer. Unlikely patient is suffering from a lipoma as area was palpated no mass was felt, possible patient felt a ligament which was felt on both sides. unLikely patient is suffering from a fracture as she denies any recent trauma to the area, there is no ecchymosis or bruising noted, patient had full range of motion, 5 out of 5 strength. Imaging and labs were deferred as vitals are reassuring and physical exam did not show any acute abnormalities.  Patient appears to be resting comfortably, does not show signs of acute distress. Vital signs are reassuring she does not meet criteria to be admitted to the hospital. Likely that patient felt a ligament or muscle and she thought it was a mass. Recommend the patient follow up with her primary care doctor for further evaluation management. Patient was given at home instruction as well as strict return precautions. Patient was explained the results and plan, she verbalized that she understood and agreed with the plan. Final Clinical Impression(s) / ED  Diagnoses Final diagnoses:  Neck pain  Pain of left thigh    Rx / DC Orders ED Discharge Orders    None       Marcello Fennel, PA-C 07/11/19 Cristino Martes, MD 07/16/19 [phone]

## 2019-07-11 NOTE — ED Triage Notes (Signed)
Pt reports swollen area to shoulder/clavicle area with pain to entire left arm for over the past month. Also has pain to left leg, specifically more severe in left thigh. Denies any injury.

## 2019-07-11 NOTE — Discharge Instructions (Addendum)
You have been seen for neck and thigh pain.  I recommend that you alternate between taking ibuprofen and Tylenol every 6 hours.  For example you can take Tylenol wait 6 hours then take ibuprofen wait another 6 hours and repeat.  Please follow the dosage and on back of  bottle.  You may also apply heat to the area as this can help with pain and help you stretch the area.  I want you to follow-up with your primary doctor in 1 week's time if symptoms persist.  Want to come back to the emergency department if you develop shortness of breath, chest pain, uncontrolled nausea, vomiting, diarrhea, any numbness or tingling in your arms or legs as the symptoms require further evaluation.

## 2019-07-15 ENCOUNTER — Other Ambulatory Visit: Payer: 59

## 2019-08-01 ENCOUNTER — Other Ambulatory Visit: Payer: Self-pay

## 2019-08-01 ENCOUNTER — Telehealth (HOSPITAL_COMMUNITY): Payer: 59 | Admitting: Psychiatric/Mental Health

## 2019-09-24 ENCOUNTER — Ambulatory Visit (INDEPENDENT_AMBULATORY_CARE_PROVIDER_SITE_OTHER): Payer: 59 | Admitting: Psychiatry

## 2019-09-24 ENCOUNTER — Encounter (HOSPITAL_COMMUNITY): Payer: Self-pay | Admitting: Psychiatry

## 2019-09-24 ENCOUNTER — Other Ambulatory Visit: Payer: Self-pay

## 2019-09-24 DIAGNOSIS — F411 Generalized anxiety disorder: Secondary | ICD-10-CM | POA: Diagnosis not present

## 2019-09-24 DIAGNOSIS — F331 Major depressive disorder, recurrent, moderate: Secondary | ICD-10-CM | POA: Insufficient documentation

## 2019-09-24 DIAGNOSIS — F4310 Post-traumatic stress disorder, unspecified: Secondary | ICD-10-CM

## 2019-09-24 MED ORDER — HYDROXYZINE HCL 10 MG PO TABS: 10.0000 mg | ORAL_TABLET | Freq: Three times a day (TID) | ORAL | 2 refills | Status: DC | PRN

## 2019-09-24 MED ORDER — ESCITALOPRAM OXALATE 20 MG PO TABS: 20.0000 mg | ORAL_TABLET | Freq: Every day | ORAL | 2 refills | Status: DC

## 2019-09-24 MED ORDER — PRAZOSIN HCL 1 MG PO CAPS: 1.0000 mg | ORAL_CAPSULE | Freq: Every day | ORAL | 2 refills | Status: DC

## 2019-09-24 MED ORDER — TRAZODONE HCL 50 MG PO TABS: 50.0000 mg | ORAL_TABLET | Freq: Every evening | ORAL | 2 refills | Status: DC | PRN
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
## 2019-11-14 ENCOUNTER — Ambulatory Visit (HOSPITAL_COMMUNITY): Payer: 59 | Admitting: Licensed Clinical Social Worker

## 2019-12-24 ENCOUNTER — Encounter (HOSPITAL_COMMUNITY): Payer: 59 | Admitting: Psychiatry

## 2019-12-31 ENCOUNTER — Other Ambulatory Visit: Payer: Self-pay

## 2019-12-31 ENCOUNTER — Telehealth (HOSPITAL_COMMUNITY): Payer: Self-pay | Admitting: Clinical

## 2019-12-31 ENCOUNTER — Ambulatory Visit (HOSPITAL_COMMUNITY): Payer: 59 | Admitting: Clinical

## 2019-12-31 NOTE — Telephone Encounter (Signed)
Therapist called the clients cell phone because she did not check in for the virtual therapy visit. Client reported she was unable to keep the appointment due to still being at work. Client reported no crisis needs at this time.

## 2020-01-28 ENCOUNTER — Encounter (HOSPITAL_COMMUNITY): Payer: 59 | Admitting: Psychiatry

## 2020-02-10 ENCOUNTER — Encounter: Payer: 59 | Admitting: Dietician

## 2020-02-25 ENCOUNTER — Encounter: Payer: Self-pay | Attending: Internal Medicine | Admitting: Dietician

## 2020-02-25 ENCOUNTER — Other Ambulatory Visit: Payer: Self-pay

## 2020-02-25 ENCOUNTER — Encounter: Payer: Self-pay | Admitting: Dietician

## 2020-02-25 VITALS — Ht 63.0 in | Wt 188.7 lb

## 2020-02-25 DIAGNOSIS — E669 Obesity, unspecified: Secondary | ICD-10-CM | POA: Insufficient documentation

## 2020-02-25 NOTE — Patient Instructions (Signed)
Choose greek yogurt for a high protein, low fat food.  Work towards eating three meals a day, about 5-6 hours apart!  Begin to recognize carbohydrates in your food choices!  Begin to build your meals using the proportions of the Balanced Plate. . First, select your carb choice(s) for the meal  . Next, select your source of protein to pair with your carb choice(s). . Finally, complete the remaining half of your meal with a variety of non-starchy vegetables.

## 2020-02-25 NOTE — Progress Notes (Signed)
Medical Nutrition Therapy  Appointment Start time:  1600  Appointment End time:  1700  Primary concerns today: Diabetes, general healthful diet.   Referral diagnosis: E66.9 Obesity, uspecified Preferred learning style: No preference indicated Learning readiness: Change in progress   NUTRITION ASSESSMENT   Anthropometrics  Wt - 188.9 lbs Ht - 5'3"  Body mass index is 33.43 kg/m.  Clinical Medical Hx: DM, obesity Medications: metformin, ozempic Labs: HDL - 33 (low),  A1c - referring doctor stated it was "in the 5's", but didn't send lab result Notable Signs/Symptoms: Fear  Lifestyle & Dietary Hx Pt reports having a hard time with somethings related to nutrition.  Pt reports some fear and anxiety over eating, which led to a 50 pound weight loss over the last year.Pt reports going through depression when they got their diagnosis of diabetes. Pt states they would be afraid to eat anything because it might set off your blood sugar. Pt reports getting hungry every other day at 10:00-11:00 PM and believes it is due to the Ozempic. Pt reports eating 2 meals a day, usually breakfast and lunch. Pt states they don't eat dinner about two times a week. Pt reports walking twice a week for about 30 minutes.  Pt reports drinking 2% milk. Pt reports Olive garden is their weakness, really loves pasta.  Pt reports hearing lots of nutrition information from [employer] and isn't sure what is credible or not.  Estimated daily fluid intake: 64 oz Supplements: Women's One a Day Sleep: Sleeps well Stress / self-care: Pt reports low stress Current average weekly physical activity: Walks, 2x a week 30 minutes  24-Hr Dietary Recall First Meal: none Snack: water Second Meal: iced coffee [employer] Snack: 6 PB crackers Third Meal: Vegetable Soup stewed beef, green beans, potatoes, tomatoes, peas, arnold palmer Snack: none Beverages: water, arnold palmer   NUTRITION DIAGNOSIS  NB-1.1 Food and  nutrition-related knowledge deficit As related to diabetes.  As evidenced by previous diagnosis of DM, fear of foods as related to blood sugar, and dietary pattern skipping meals.   NUTRITION INTERVENTION  Nutrition education (E-1) on the following topics:  Educated patient on the pathophysiology of diabetes. This includes why our bodies need circulating blood sugar, the relationship between insulin and blood sugar, and the results of insulin resistance and/or pancreatic insufficiency on the development of diabetes. Educated patient on factors that contribute to elevation of blood sugars, such as stress, illness, injury,and food choices. Discussed the role that physical activity plays in lowering blood sugar. Educate patient on the three main macronutrients. Protein, fats, and carbohydrates. Discussed how each of these macronutrients affect blood sugar levels, especially carbohydrate, and the importance of eating a consistent amount of carbohydrate throughout the day. Educated patient on carbohydrate counting, 15g of carbohydrate equals one carb choice. Educated patient on the balanced plate eating model. Recommended lunch and dinner be 1/2 non-starchy vegetables, 1/4 starches, and 1/4 protein. Recommended breakfast be a balance of starch and protein with a piece of fruit. Discussed with patient the importance of working towards hitting the proportions of the balanced plate consistently.    Handouts Provided Include   Yellow Meal Card  Balanced Plate  Learning Style & Readiness for Change Teaching method utilized: Visual & Auditory  Demonstrated degree of understanding via: Teach Back  Barriers to learning/adherence to lifestyle change: fear/axiety over eating  Goals Established by Pt  Choose greek yogurt for a high protein, low fat food.  Work towards eating three meals a day, about  5-6 hours apart!  Begin to recognize carbohydrates in your food choices!  Begin to build your meals using  the proportions of the Balanced Plate.  First, select your carb choice(s) for the meal   Next, select your source of protein to pair with your carb choice(s).  Finally, complete the remaining half of your meal with a variety of non-starchy vegetables.   MONITORING & EVALUATION Dietary intake, weekly physical activity, and meal consistency in 2 weeks.  Next Steps  Patient is to follow up with dietitian.

## 2020-03-10 ENCOUNTER — Other Ambulatory Visit: Payer: Self-pay

## 2020-03-10 ENCOUNTER — Ambulatory Visit (INDEPENDENT_AMBULATORY_CARE_PROVIDER_SITE_OTHER): Payer: No Payment, Other | Admitting: Clinical

## 2020-03-10 ENCOUNTER — Ambulatory Visit: Payer: Self-pay | Admitting: Dietician

## 2020-03-10 DIAGNOSIS — F331 Major depressive disorder, recurrent, moderate: Secondary | ICD-10-CM | POA: Diagnosis not present

## 2020-03-10 DIAGNOSIS — F4310 Post-traumatic stress disorder, unspecified: Secondary | ICD-10-CM
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
## 2020-04-11 ENCOUNTER — Other Ambulatory Visit (HOSPITAL_COMMUNITY): Payer: Self-pay | Admitting: Psychiatry

## 2020-04-11 DIAGNOSIS — F331 Major depressive disorder, recurrent, moderate: Secondary | ICD-10-CM

## 2020-11-03 ENCOUNTER — Other Ambulatory Visit (HOSPITAL_COMMUNITY): Payer: Self-pay | Admitting: Psychiatry

## 2020-11-03 DIAGNOSIS — F411 Generalized anxiety disorder: Secondary | ICD-10-CM

## 2020-11-03 DIAGNOSIS — F331 Major depressive disorder, recurrent, moderate: Secondary | ICD-10-CM

## 2020-12-03 IMAGING — US US SOFT TISSUE HEAD/NECK
1 series · 13 of 13 positions shown · non-contrast
Comparison: None.

CLINICAL DATA: Palpable nontender nodule involving the base of the
left side of the neck.

EXAM:
ULTRASOUND OF HEAD/NECK SOFT TISSUES
TECHNIQUE: Ultrasound examination of the head and neck soft tissues was
performed in the area of clinical concern.

[Series 1: us soft tissue head/neck · 0.06mm/px · 13 of 13 slices shown]
[im 1/13]
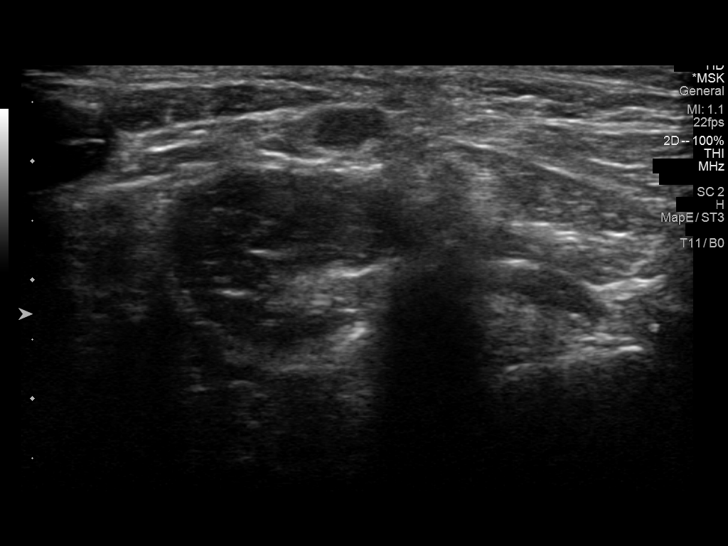
[im 2/13]
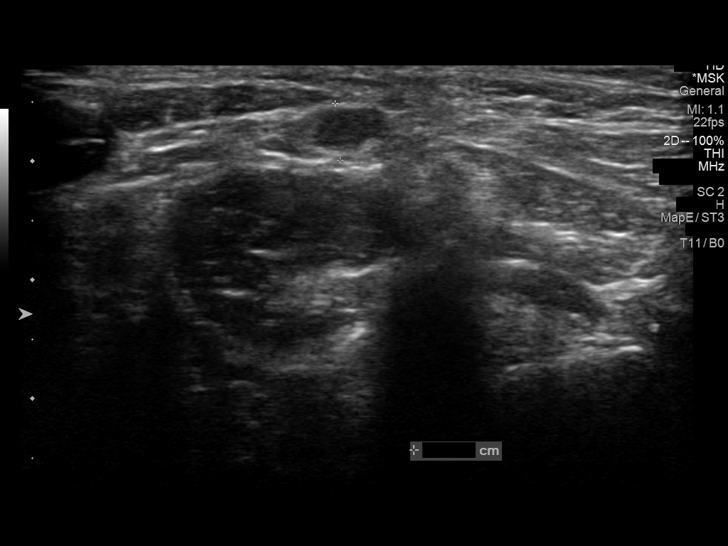
[im 3/13]
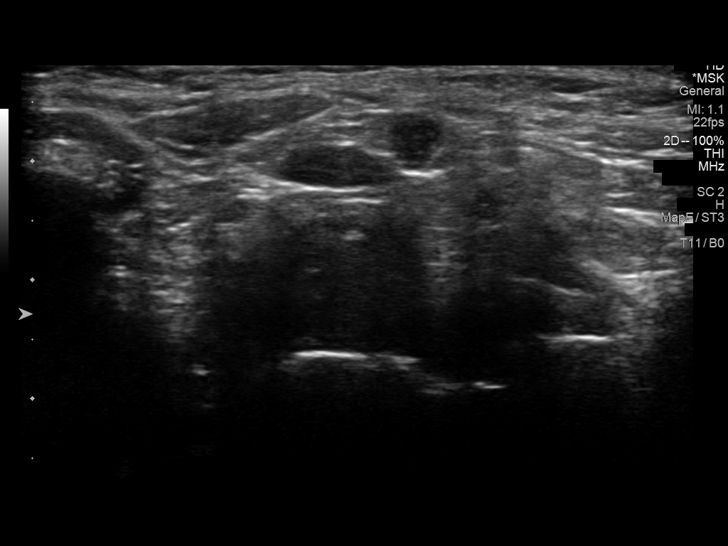
[im 4/13]
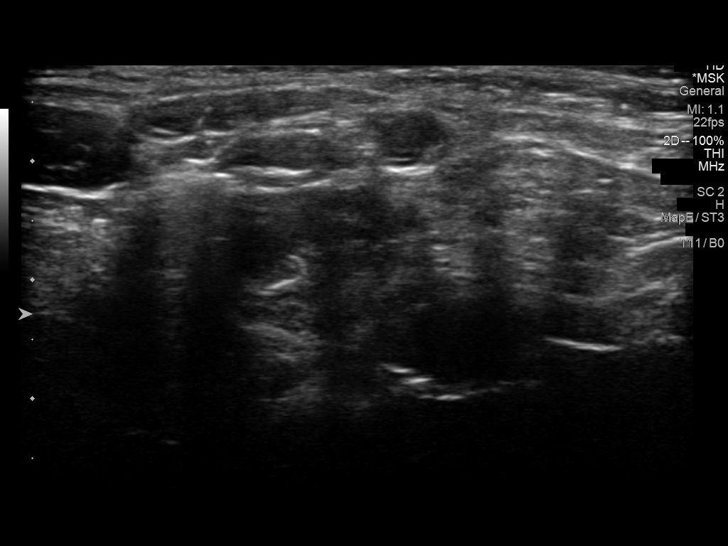
[im 5/13]
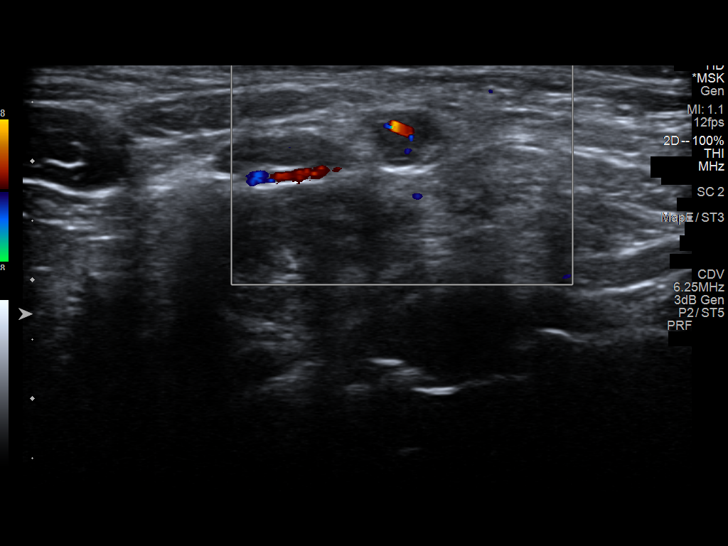
[im 6/13]
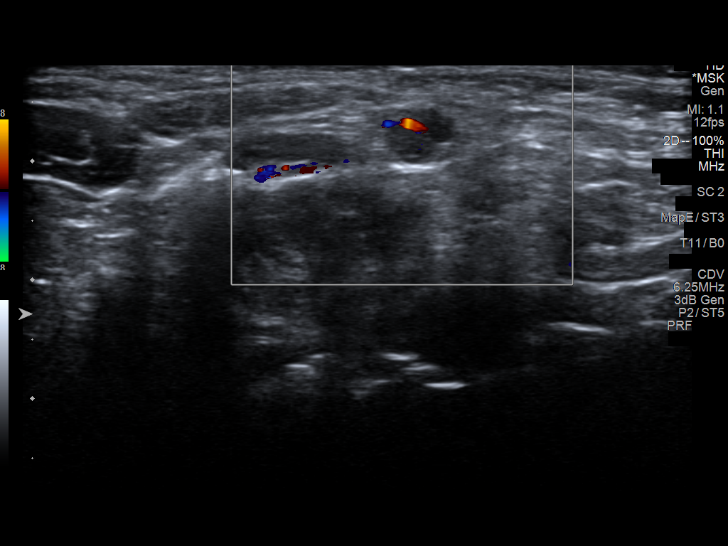
[im 7/13]
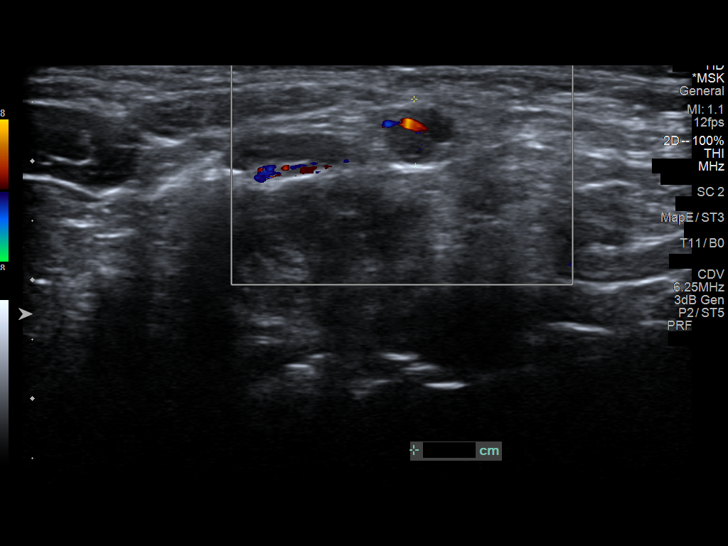
[im 8/13]
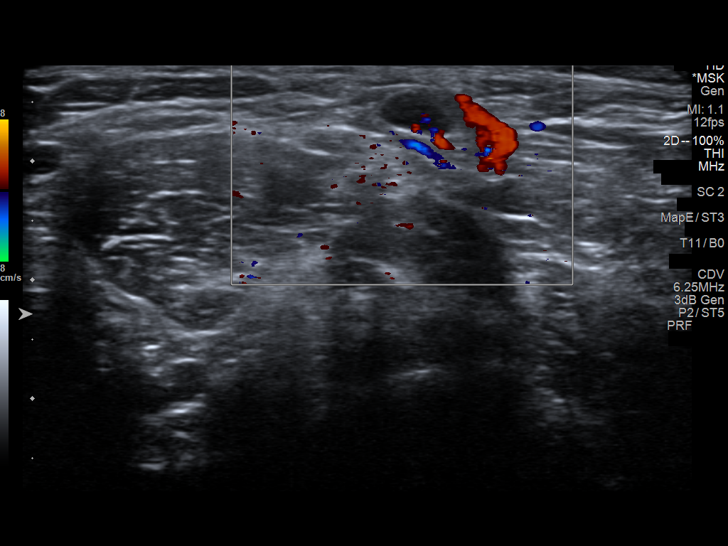
[im 9/13]
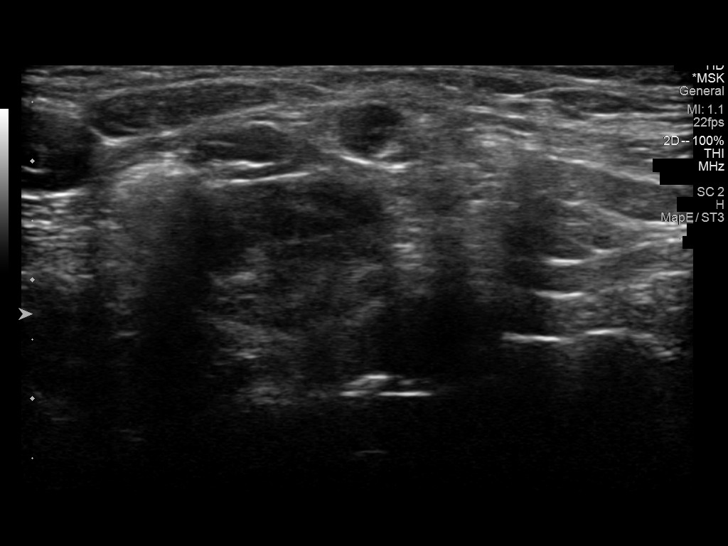
[im 10/13]
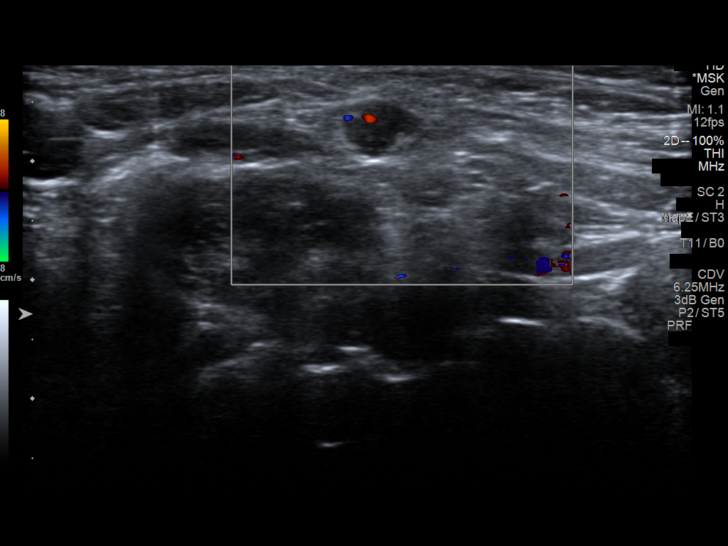
[im 11/13]
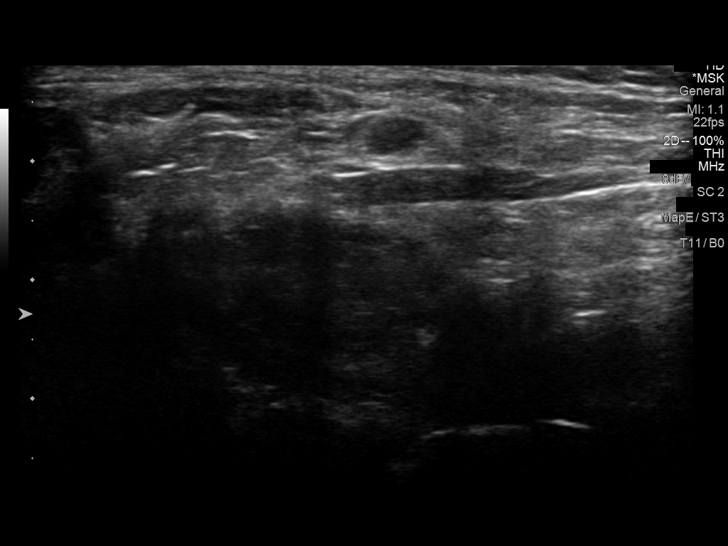
[im 12/13]
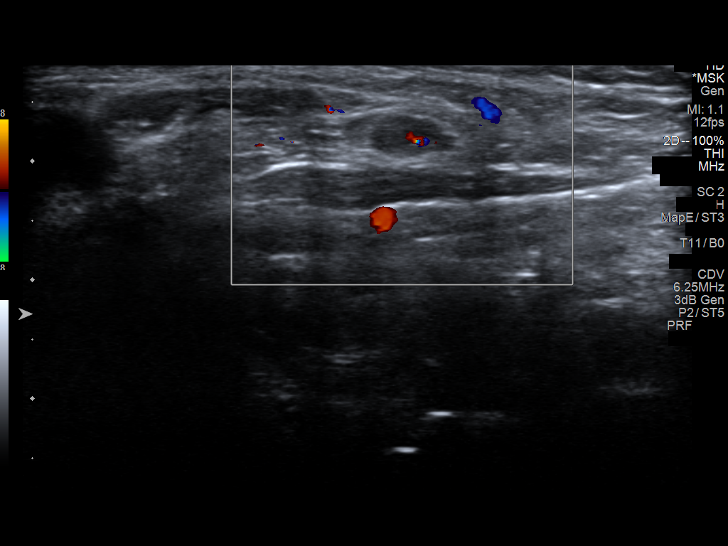
[im 13/13]
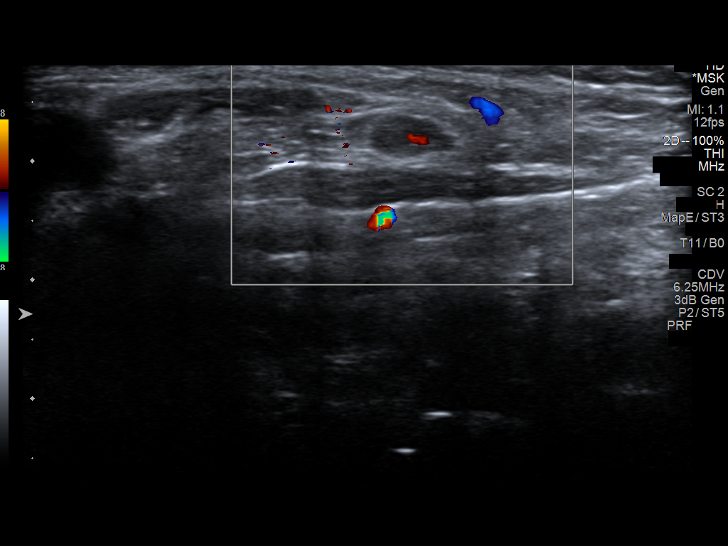

[13 of 13 positions shown; findings below may reference images not displayed]

FINDINGS: Patient's palpable area of concern involving the base of the left
side of the neck correlates with a benign appearing non
pathologically enlarged cervical lymph node. The lymph node is not
enlarged by size criteria measuring 0.5 cm in diameter and maintains
a benign fatty hilum (image 10).

Otherwise, there is no discrete correlate for patient's palpable
area of concern. Specifically, no discrete solid or cystic lesions.
IMPRESSION: Patient's palpable area of concern involving the base of the left
side of the neck correlates with a benign appearing non
pathologically enlarged cervical lymph node, nonspecific though
presumably reactive in etiology. Clinical correlation to resolution
is advised.

## 2020-12-08 ENCOUNTER — Emergency Department (HOSPITAL_COMMUNITY)
Admission: EM | Admit: 2020-12-08 | Discharge: 2020-12-08 | Disposition: A | Discharge status: Home / Self Care | Payer: 59 | Attending: Emergency Medicine | Admitting: Emergency Medicine

## 2020-12-08 ENCOUNTER — Encounter (HOSPITAL_COMMUNITY): Payer: Self-pay | Admitting: Pediatrics

## 2020-12-08 ENCOUNTER — Emergency Department (HOSPITAL_COMMUNITY): Payer: 59

## 2020-12-08 ENCOUNTER — Other Ambulatory Visit: Payer: Self-pay

## 2020-12-08 DIAGNOSIS — R000 Tachycardia, unspecified: Secondary | ICD-10-CM | POA: Diagnosis not present

## 2020-12-08 DIAGNOSIS — Z7984 Long term (current) use of oral hypoglycemic drugs: Secondary | ICD-10-CM | POA: Insufficient documentation

## 2020-12-08 DIAGNOSIS — R102 Pelvic and perineal pain: Secondary | ICD-10-CM | POA: Diagnosis present

## 2020-12-08 DIAGNOSIS — Z79899 Other long term (current) drug therapy: Secondary | ICD-10-CM | POA: Diagnosis not present

## 2020-12-08 DIAGNOSIS — K644 Residual hemorrhoidal skin tags: Secondary | ICD-10-CM | POA: Insufficient documentation

## 2020-12-08 DIAGNOSIS — E119 Type 2 diabetes mellitus without complications: Secondary | ICD-10-CM | POA: Insufficient documentation

## 2020-12-08 DIAGNOSIS — N739 Female pelvic inflammatory disease, unspecified: Secondary | ICD-10-CM | POA: Diagnosis not present

## 2020-12-08 DIAGNOSIS — J3489 Other specified disorders of nose and nasal sinuses: Secondary | ICD-10-CM | POA: Diagnosis not present

## 2020-12-08 DIAGNOSIS — N730 Acute parametritis and pelvic cellulitis: Secondary | ICD-10-CM

## 2020-12-08 DIAGNOSIS — Z20822 Contact with and (suspected) exposure to covid-19: Secondary | ICD-10-CM | POA: Diagnosis not present

## 2020-12-08 DIAGNOSIS — J101 Influenza due to other identified influenza virus with other respiratory manifestations: Secondary | ICD-10-CM | POA: Insufficient documentation

## 2020-12-08 LAB — COMPREHENSIVE METABOLIC PANEL
ALT: 11 U/L (ref 0–44)
AST: 16 U/L (ref 15–41)
Albumin: 3.9 g/dL (ref 3.5–5.0)
Alkaline Phosphatase: 65 U/L (ref 38–126)
Anion gap: 9 (ref 5–15)
BUN: 5 mg/dL — ABNORMAL LOW (ref 6–20)
CO2: 27 mmol/L (ref 22–32)
Calcium: 8.8 mg/dL — ABNORMAL LOW (ref 8.9–10.3)
Chloride: 102 mmol/L (ref 98–111)
Creatinine, Ser: 0.74 mg/dL (ref 0.44–1.00)
GFR, Estimated: >60 mL/min (ref >60)
Glucose, Bld: 115 mg/dL — ABNORMAL HIGH (ref 70–99)
Potassium: 3.8 mmol/L (ref 3.5–5.1)
Sodium: 138 mmol/L (ref 135–145)
Total Bilirubin: 0.6 mg/dL (ref 0.3–1.2)
Total Protein: 7.6 g/dL (ref 6.5–8.1)

## 2020-12-08 LAB — CBC WITH DIFFERENTIAL/PLATELET
Abs Immature Granulocytes: 0.03 10*3/uL (ref 0.00–0.07)
Basophils Absolute: 0 10*3/uL (ref 0.0–0.1)
Basophils Relative: 1 %
Eosinophils Absolute: 0 10*3/uL (ref 0.0–0.5)
Eosinophils Relative: 0 %
HCT: 39.2 % (ref 36.0–46.0)
Hemoglobin: 11.8 g/dL — ABNORMAL LOW (ref 12.0–15.0)
Immature Granulocytes: 0 %
Lymphocytes Relative: 5 %
Lymphs Abs: 0.4 10*3/uL — ABNORMAL LOW (ref 0.7–4.0)
MCH: 25 pg — ABNORMAL LOW (ref 26.0–34.0)
MCHC: 30.1 g/dL (ref 30.0–36.0)
MCV: 83.1 fL (ref 80.0–100.0)
Monocytes Absolute: 1 10*3/uL (ref 0.1–1.0)
Monocytes Relative: 12 %
Neutro Abs: 6.8 10*3/uL (ref 1.7–7.7)
Neutrophils Relative %: 82 %
Platelets: 328 10*3/uL (ref 150–400)
RBC: 4.72 MIL/uL (ref 3.87–5.11)
RDW: 14.9 % (ref 11.5–15.5)
WBC: 8.2 10*3/uL (ref 4.0–10.5)
nRBC: 0 % (ref 0.0–0.2)

## 2020-12-08 LAB — D-DIMER, QUANTITATIVE: D-Dimer, Quant: <0.27 ug/mL-FEU (ref 0.00–0.50)

## 2020-12-08 LAB — WET PREP, GENITAL
Clue Cells Wet Prep HPF POC: NONE SEEN
Sperm: NONE SEEN
Trich, Wet Prep: NONE SEEN
WBC, Wet Prep HPF POC: >=10 — AB (ref <10)
Yeast Wet Prep HPF POC: NONE SEEN

## 2020-12-08 LAB — I-STAT BETA HCG BLOOD, ED (MC, WL, AP ONLY): I-stat hCG, quantitative: <5 m[IU]/mL (ref <5)

## 2020-12-08 LAB — RESP PANEL BY RT-PCR (FLU A&B, COVID) ARPGX2
Influenza A by PCR: POSITIVE — AB
Influenza B by PCR: NEGATIVE
SARS Coronavirus 2 by RT PCR: NEGATIVE

## 2020-12-08 LAB — POC OCCULT BLOOD, ED: Fecal Occult Bld: NEGATIVE

## 2020-12-08 MED ORDER — HYDROCORTISONE (PERIANAL) 2.5 % EX CREA: 1.0000 "application " | TOPICAL_CREAM | Freq: Two times a day (BID) | CUTANEOUS | 0 refills | Status: AC

## 2020-12-08 MED ORDER — SODIUM CHLORIDE 0.9 % IV BOLUS (SEPSIS)
1000.0000 mL | Freq: Once | INTRAVENOUS | Status: AC
  Administered 2020-12-08: 1000 mL via INTRAVENOUS

## 2020-12-08 MED ORDER — ACETAMINOPHEN 500 MG PO TABS
1000.0000 mg | ORAL_TABLET | Freq: Once | ORAL | Status: AC
  Administered 2020-12-08: 1000 mg via ORAL
  Filled 2020-12-08: qty 2

## 2020-12-08 MED ORDER — LIDOCAINE HCL (PF) 1 % IJ SOLN
INTRAMUSCULAR | Status: AC
  Administered 2020-12-08: 1 mL
  Filled 2020-12-08: qty 5

## 2020-12-08 MED ORDER — LACTATED RINGERS IV BOLUS: 1000.0000 mL | Freq: Once | INTRAVENOUS | Status: DC

## 2020-12-08 MED ORDER — METRONIDAZOLE 500 MG PO TABS: 500.0000 mg | ORAL_TABLET | Freq: Two times a day (BID) | ORAL | 0 refills | Status: AC

## 2020-12-08 MED ORDER — CEFTRIAXONE SODIUM 500 MG IJ SOLR
500.0000 mg | Freq: Once | INTRAMUSCULAR | Status: AC
  Administered 2020-12-08: 500 mg via INTRAMUSCULAR
  Filled 2020-12-08: qty 500

## 2020-12-08 MED ORDER — SODIUM CHLORIDE 0.9 % IV SOLN
100.0000 mg | Freq: Once | INTRAVENOUS | Status: AC
  Administered 2020-12-08: 100 mg via INTRAVENOUS
  Filled 2020-12-08: qty 100

## 2020-12-08 MED ORDER — DOXYCYCLINE HYCLATE 100 MG PO CAPS: 100.0000 mg | ORAL_CAPSULE | Freq: Two times a day (BID) | ORAL | 0 refills | Status: DC

## 2020-12-08 MED ORDER — DOXYCYCLINE HYCLATE 100 MG PO CAPS: 100.0000 mg | ORAL_CAPSULE | Freq: Two times a day (BID) | ORAL | 0 refills | Status: AC

## 2020-12-08 MED ORDER — IOHEXOL 300 MG/ML  SOLN
100.0000 mL | Freq: Once | INTRAMUSCULAR | Status: AC | PRN
  Administered 2020-12-08: 100 mL via INTRAVENOUS

## 2020-12-08 NOTE — ED Triage Notes (Signed)
C/o rectal bleeding + vaginal pain for approx . 2 weeks. C/o bilateral ear pain, cough and chest pains since Saturday. Stated taken motrin this morning and has not helped,

## 2020-12-08 NOTE — ED Notes (Signed)
Notified Lawsing MD that pt says she is ready for exam

## 2020-12-08 NOTE — ED Provider Notes (Signed)
Emergency Medicine Provider Triage Evaluation Note  Martha Lopez , a 30 y.o. female  was evaluated in triage.  Pt complains of upper respiratory symptoms and vaginal pain.  She states that since Saturday she has had congestion, rhinorrhea, cough and headache.  She denies any sick contacts, chest pain or shortness of breath.  She also notes that yesterday she was having itching from her vagina.  She states that she placed a mirror between her legs to see "what was going on."  She states that she saw a bump between her vagina and her rectum.  She has noted yellow discharge.  She also notes what she thinks is rectal bleeding.  She states that it is a small amount of bright red blood but she is unsure if it is coming from her rectum or her vagina.  She denies lightheadedness or dizziness.  Review of Systems  Positive: See above Negative:   Physical Exam  BP (!) 153/93 (BP Location: Right Arm)   Pulse (!) 122   Temp (!) 100.4 F (38 C) (Oral)   Resp 20   Ht 5\' 3"  (1.6 m)   LMP 11/24/2020 (Approximate)   SpO2 100%   BMI 33.43 kg/m  Gen:   Awake, no distress   Resp:  Normal effort  MSK:   Moves extremities without difficulty  Other:    Medical Decision Making  Medically screening exam initiated at 3:46 PM.  Appropriate orders placed.  Martha Lopez was informed that the remainder of the evaluation will be completed by another provider, this initial triage assessment does not replace that evaluation, and the importance of remaining in the ED until their evaluation is complete.     Wonda Horner, PA-C 12/08/20 1548    12/10/20, MD 12/08/20 1949

## 2020-12-08 NOTE — Discharge Instructions (Addendum)
You were evaluated in the Emergency Department and after careful evaluation, we did not find any emergent condition requiring admission or further testing in the hospital.  Your exam/testing today was positive for the following findings: You have an external hemorrhoid which may be causing intermittent rectal pain and bleeding.  I have attached instructions regarding treatment for this.  Sitz baths are helpful, which you can get over the counter. You also tested positive for Influenza. We will treat with Tamiflu as you are within the window and requested treatment. Also recommend Tylenol and Ibuprofen for pain and fever. Finally, you have findings concerning for pelvic inflammatory disease, with vaginal and cervical discharge, and bilateral pelvic pain. Your CT scan was reassuring. We will treat with a 14 day course of doxycycline and flagyl and have you follow-up with your PCP. Do not drink alcohol while taking flagyl.  Please return to the Emergency Department if you experience any worsening of your condition.  Thank you for allowing [place] to be a part of your care.

## 2020-12-08 NOTE — ED Notes (Signed)
Patient is resting comfortably. Updated on POC and discharge orders
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
## 2020-12-08 NOTE — ED Provider Notes (Signed)
[facility] EMERGENCY DEPARTMENT Provider Note   CSN: [id number] Arrival date & time: 12/08/20  1525     History Chief Complaint  Patient presents with   Rectal Bleeding   Vaginal Pain   Otalgia   Cough    Martha Lopez is a 30 y.o. female.   Rectal Bleeding Associated symptoms: fever   Associated symptoms: no abdominal pain and no vomiting   Vaginal Pain Associated symptoms include chest pain. Pertinent negatives include no abdominal pain and no shortness of breath.  Otalgia Associated symptoms: congestion, cough, fever and rhinorrhea   Associated symptoms: no abdominal pain and no vomiting   Cough Associated symptoms: chest pain, ear pain, fever and rhinorrhea   Associated symptoms: no chills and no shortness of breath    30 year old female with a history of anxiety, depression, diabetes mellitus who presents emergency department with multiple complaints.  The patient states that she has had intermittent rectal bleeding for the past 2 weeks.  She checked her bottom with a mirror and noted a mass there that she had not seen before.  She has some pain with defecation and noted bright red blood on her stool with some mixed in with her stool when defecating.  Additionally, she has had shortness of breath, cough, pleuritic chest discomfort with URI symptoms of nasal congestion and bilateral ear pain since this past Saturday.  She took Motrin this morning which did not help.  She additionally complains of vaginal discharge with bilateral lower abdominal/pelvic discomfort.  Past Medical History:  Diagnosis Date   Anxiety    Anxiety    Depression    Depression    Diabetes mellitus without complication (HCC)    Epistaxis    Migraines     Patient Active Problem List   Diagnosis Date Noted   Moderate episode of recurrent major depressive disorder (HCC) 09/24/2019   PTSD (post-traumatic stress disorder) 09/24/2019   Fall down stairs 02/26/2012   Contusion  of back(922.31) 02/26/2012    No past surgical history on file.   OB History     Gravida  0   Para  0   Term  0   Preterm  0   AB  0   Living  0      SAB  0   IAB  0   Ectopic  0   Multiple  0   Live Births  0           Family History  Problem Relation Age of Onset   Hypertension Mother    Gout Father     Social History   Tobacco Use   Smoking status: Never   Smokeless tobacco: Never  Vaping Use   Vaping Use: Never used  Substance Use Topics   Alcohol use: No   Drug use: No    Home Medications Prior to Admission medications   Medication Sig Start Date End Date Taking? Authorizing Provider  acetaminophen (TYLENOL) 500 MG tablet Take 1,000 mg by mouth every 6 (six) hours as needed for mild pain.   Yes [provider]  escitalopram (LEXAPRO) 20 MG tablet TAKE 1 TABLET(20 MG) BY MOUTH DAILY Patient taking differently: Take 20 mg by mouth in the morning and at bedtime. 11/04/20  Yes Toy Cookey E, NP  hydrocortisone (ANUSOL-HC) 2.5 % rectal cream Place 1 application rectally 2 (two) times daily. 12/08/20  Yes Ernie Avena, MD  hydrOXYzine (ATARAX/VISTARIL) 10 MG tablet Take 1 tablet (10 mg total)  by mouth 3 (three) times daily as needed. 09/24/19  Yes Toy Cookey E, NP  metFORMIN (GLUCOPHAGE-XR) 750 MG 24 hr tablet Take 750 mg by mouth 2 (two) times daily. 07/05/19  Yes [provider]  metroNIDAZOLE (FLAGYL) 500 MG tablet Take 1 tablet (500 mg total) by mouth 2 (two) times daily for 14 days. 12/08/20 12/22/20 Yes Ernie Avena, MD  Multiple Vitamins-Minerals (MULTIVITAMIN ADULT) CHEW Chew 1 tablet by mouth daily.   Yes [provider]  OZEMPIC, 0.25 OR 0.5 MG/DOSE, 2 MG/1.5ML SOPN Inject 1 mg into the skin as directed. Take on Wednesday 04/24/19  Yes [provider]  prazosin (MINIPRESS) 1 MG capsule Take 1 capsule (1 mg total) by mouth at bedtime. Patient taking differently: Take 1 mg by mouth 2 (two) times a  week. 09/24/19  Yes Toy Cookey E, NP  tiZANidine (ZANAFLEX) 2 MG tablet Take 2 mg by mouth every 8 (eight) hours as needed for muscle spasms. 06/12/19  Yes [provider]  traZODone (DESYREL) 50 MG tablet TAKE 1 TO 2 TABLETS(50 TO 100 MG) BY MOUTH AT BEDTIME AS NEEDED FOR SLEEP Patient taking differently: Take 50-100 mg by mouth at bedtime as needed for sleep. 04/13/20  Yes Toy Cookey E, NP  doxycycline (VIBRAMYCIN) 100 MG capsule Take 1 capsule (100 mg total) by mouth 2 (two) times daily for 14 days. 12/08/20 12/22/20  Ernie Avena, MD    Allergies    Patient has no known allergies.  Review of Systems   Review of Systems  Constitutional:  Positive for fever. Negative for chills.  HENT:  Positive for congestion, ear pain and rhinorrhea.   Respiratory:  Positive for cough. Negative for shortness of breath.   Cardiovascular:  Positive for chest pain.  Gastrointestinal:  Positive for anal bleeding, blood in stool, hematochezia and rectal pain. Negative for abdominal pain, nausea and vomiting.  Genitourinary:  Positive for pelvic pain and vaginal pain. Negative for dysuria.  All other systems reviewed and are negative.  Physical Exam Updated Vital Signs BP 123/77   Pulse (!) 114   Temp 99.5 F (37.5 C) (Oral)   Resp 13   Ht 5\' 3"  (1.6 m)   LMP 11/24/2020 (Approximate)   SpO2 100%   BMI 33.43 kg/m   Physical Exam Vitals and nursing note reviewed. Exam conducted with a chaperone present.  Constitutional:      General: She is not in acute distress.    Appearance: She is well-developed.  HENT:     Head: Normocephalic and atraumatic.  Eyes:     Conjunctiva/sclera: Conjunctivae normal.  Cardiovascular:     Rate and Rhythm: Regular rhythm. Tachycardia present.     Heart sounds: No murmur heard. Pulmonary:     Effort: Pulmonary effort is normal. No respiratory distress.     Breath sounds: Normal breath sounds.  Abdominal:     Palpations: Abdomen is soft.      Tenderness: There is abdominal tenderness in the right lower quadrant, suprapubic area and left lower quadrant.  Genitourinary:    Cervix: Cervical motion tenderness present.     Uterus: Tender.      Adnexa:        Right: Tenderness present.        Left: Tenderness present.      Rectum: Guaiac result negative. External hemorrhoid present. No anal fissure.     Comments: External hemorrhoid present, nonthrombosed and no evidence of bleeding.  Cervical motion tenderness with bilateral adnexal and uterine  tenderness to palpation.  Large amount of cervical discharge present Musculoskeletal:        General: No swelling.     Cervical back: Neck supple.  Skin:    General: Skin is warm and dry.     Capillary Refill: Capillary refill takes less than 2 seconds.  Neurological:     Mental Status: She is alert.  Psychiatric:        Mood and Affect: Mood normal.    ED Results / Procedures / Treatments   Labs (all labs ordered are listed, but only abnormal results are displayed) Labs Reviewed  RESP PANEL BY RT-PCR (FLU A&B, COVID) ARPGX2 - Abnormal; Notable for the following components:      Result Value   Influenza A by PCR POSITIVE (*)    All other components within normal limits  WET PREP, GENITAL - Abnormal; Notable for the following components:   WBC, Wet Prep HPF POC >=10 (*)    All other components within normal limits  COMPREHENSIVE METABOLIC PANEL - Abnormal; Notable for the following components:   Glucose, Bld 115 (*)    BUN 5 (*)    Calcium 8.8 (*)    All other components within normal limits  CBC WITH DIFFERENTIAL/PLATELET - Abnormal; Notable for the following components:   Hemoglobin 11.8 (*)    MCH 25.0 (*)    Lymphs Abs 0.4 (*)    All other components within normal limits  D-DIMER, QUANTITATIVE  I-STAT BETA HCG BLOOD, ED ([facility], [facility], [facility] ONLY)  POC OCCULT BLOOD, ED  GC/CHLAMYDIA PROBE AMP ([place]) NOT AT [facility]    EKG EKG Interpretation  Date/Time:  Tuesday December 08 2020 16:23:41 EST Ventricular Rate:  123 PR Interval:  140 QRS Duration: 86 QT Interval:  292 QTC Calculation: 418 R Axis:   91 Text Interpretation: Sinus tachycardia Prominent P waves, nondiagnostic Borderline right axis deviation Borderline T wave abnormalities Baseline wander in lead(s) V5 Confirmed by Ernie Avena ([id number]) on 12/08/2020 4:53:08 PM  Radiology CT ABDOMEN PELVIS W CONTRAST  Result Date: 12/08/2020 CLINICAL DATA:  Abdominal pain, fever, perineal discharge, rectal bleeding EXAM: CT ABDOMEN AND PELVIS WITH CONTRAST TECHNIQUE: Multidetector CT imaging of the abdomen and pelvis was performed using the standard protocol following bolus administration of intravenous contrast. CONTRAST:  OMNIPAQUE IOHEXOL 300 MG/ML  SOLN COMPARISON:  None. FINDINGS: Lower chest: No acute pleural or parenchymal lung disease. Hepatobiliary: No focal liver abnormality is seen. No gallstones, gallbladder wall thickening, or biliary dilatation. Pancreas: Unremarkable. No pancreatic ductal dilatation or surrounding inflammatory changes. Spleen: Normal in size without focal abnormality. Adrenals/Urinary Tract: Adrenal glands are unremarkable. Kidneys are normal, without renal calculi, focal lesion, or hydronephrosis. Bladder is unremarkable. Stomach/Bowel: No bowel obstruction or ileus. Normal appendix right lower quadrant. No bowel wall thickening or inflammatory change. Vascular/Lymphatic: Bilateral inguinal lymph nodes are identified, largest on the left measuring 13 mm in short axis, likely reactive. No pathologic adenopathy elsewhere within the abdomen or pelvis. No significant vascular findings. Reproductive: Uterus and bilateral adnexa are unremarkable. Other: No free fluid or free gas. No abdominal wall hernia. Limited evaluation through the perineum demonstrates no evidence of fluid collection or abscess. Musculoskeletal: No acute or destructive bony lesions. Reconstructed images demonstrate no  additional findings. IMPRESSION: 1. Borderline enlarged inguinal lymph nodes likely reactive. 2. Otherwise no acute intra-abdominal or intrapelvic process. Electronically Signed   By: Sharlet Salina M.D.   On: 12/08/2020 18:34   DG Chest Portable 1 View  Result Date: 12/08/2020 CLINICAL DATA:  Cough EXAM: PORTABLE CHEST 1 VIEW COMPARISON:  None. FINDINGS: The heart size and mediastinal contours are within normal limits. Both lungs are clear. The visualized skeletal structures are unremarkable. IMPRESSION: No active disease. Electronically Signed   By: Jasmine Pang M.D.   On: 12/08/2020 17:32    Procedures Procedures   Medications Ordered in ED Medications  acetaminophen (TYLENOL) tablet 1,000 mg (1,000 mg Oral Given 12/08/20 1730)  cefTRIAXone (ROCEPHIN) injection 500 mg (500 mg Intramuscular Given 12/08/20 1730)  doxycycline (VIBRAMYCIN) 100 mg in sodium chloride 0.9 % 250 mL IVPB (0 mg Intravenous Stopped 12/08/20 1954)  lidocaine (PF) (XYLOCAINE) 1 % injection (1 mL  Given 12/08/20 1730)  sodium chloride 0.9 % bolus 1,000 mL (0 mLs Intravenous Stopped 12/08/20 1953)  iohexol (OMNIPAQUE) 300 MG/ML solution 100 mL (100 mLs Intravenous Contrast Given 12/08/20 1818)    ED Course  I have reviewed the triage vital signs and the nursing notes.  Pertinent labs & imaging results that were available during my care of the patient were reviewed by me and considered in my medical decision making (see chart for details).  Clinical Course as of 12/09/20 2147  Tue Dec 08, 2020  1839 Influenza A By PCR(!): POSITIVE [JL]    Clinical Course User Index [JL] Ernie Avena, MD   MDM Rules/Calculators/A&P                            30 year old female with a history of anxiety, depression, diabetes mellitus who presents emergency department with multiple complaints.  The patient states that she has had intermittent rectal bleeding for the past 2 weeks.  She checked her bottom with a mirror and  noted a mass there that she had not seen before.  She has some pain with defecation and noted bright red blood on her stool with some mixed in with her stool when defecating.  Additionally, she has had shortness of breath, cough, pleuritic chest discomfort with URI symptoms of nasal congestion and bilateral ear pain since this past Saturday.  She took Motrin this morning which did not help.  She additionally complains of vaginal discharge with bilateral lower abdominal/pelvic discomfort.  On arrival, the patient was febrile to 100.4, tachycardic to 122, hemodynamically stable, Mildly hypertensive BP 153/93, not tachypneic, saturating 100% on room air.  Physical exam significant for Bilateral lower abdominal/pelvic tenderness to palpation, bilateral adnexal tenderness to palpation, cervical motion tenderness with cervical discharge present.  Symptoms are concerning for possible PID.  The patient also presents with upper respiratory infectious symptoms consistent with either viral URI, influenza or COVID-19.  COVID-19 and influenza PCR testing was collected and resulted positive for influenza A.  The patient was febrile and was administered Tylenol for fever.  She was covered for PID with Rocephin and doxycycline.  She has no leukocytosis, CMP that is unremarkable and is not pregnant.  D-dimer was collected and was negative.  Her chest discomfort is likely pleuritic in the setting of her influenza.  Low suspicion for ACS.  A chest x-ray did not reveal any acute abnormalities.  A CT abdomen pelvis was performed and also was without acute intra-abdominal abnormalities.  An EKG was nonischemic.  The patient symptoms are consistent with likely influenza and possible developing PID.  Additionally, her rectal bleeding can be explained by her external hemorrhoid.  She was provided with a prescription for Anusol cream and advised sitz bath's.  She has a mild anemia to 11.8.  Provided instructions regarding care of her  hemorrhoid.  Additionally provided a prescription for Tamiflu.  GC and Chlamydia testing was collected and pending.  She was provided a prescription for doxycycline and Flagyl to cover her empirically for PID, provided with return precautions and advised not to drink alcohol while taking Flagyl.  Overall, the patient was symptomatically improved.  Mildly tachycardic but the patient is orally rehydrating with improvement.  Believe her tachycardia is in the setting of her influenza, fever and mild dehydration.  Offered IV fluid resuscitation but the patient refused.  Believe that the patient can continue to orally rehydrate at home with close return precaution provided in the event of dehydration and worsening symptoms.  Final Clinical Impression(s) / ED Diagnoses Final diagnoses:  Influenza A  External hemorrhoid  PID (acute pelvic inflammatory disease)    Rx / DC Orders ED Discharge Orders          Ordered    doxycycline (VIBRAMYCIN) 100 MG capsule  2 times daily,   Status:  Discontinued        12/08/20 1913    hydrocortisone (ANUSOL-HC) 2.5 % rectal cream  2 times daily        12/08/20 1914    doxycycline (VIBRAMYCIN) 100 MG capsule  2 times daily        12/08/20 1919    metroNIDAZOLE (FLAGYL) 500 MG tablet  2 times daily        12/08/20 1919             Ernie Avena, MD 12/09/20 2147

## 2020-12-09 LAB — GC/CHLAMYDIA PROBE AMP (~~LOC~~) NOT AT ARMC
Chlamydia: NEGATIVE
Comment: NEGATIVE
Comment: NORMAL
Neisseria Gonorrhea: NEGATIVE

## 2021-01-05 ENCOUNTER — Other Ambulatory Visit (HOSPITAL_COMMUNITY): Payer: Self-pay | Admitting: Psychiatry

## 2021-01-05 DIAGNOSIS — F331 Major depressive disorder, recurrent, moderate: Secondary | ICD-10-CM

## 2021-01-18 ENCOUNTER — Other Ambulatory Visit: Payer: Self-pay

## 2021-01-18 ENCOUNTER — Emergency Department (HOSPITAL_COMMUNITY)
Admission: EM | Admit: 2021-01-18 | Discharge: 2021-01-19 | Discharge status: Against Medical Advice | Payer: 59 | Attending: Emergency Medicine | Admitting: Emergency Medicine

## 2021-01-18 ENCOUNTER — Encounter (HOSPITAL_COMMUNITY): Payer: Self-pay | Admitting: Emergency Medicine

## 2021-01-18 DIAGNOSIS — Y92410 Unspecified street and highway as the place of occurrence of the external cause: Secondary | ICD-10-CM | POA: Diagnosis not present

## 2021-01-18 DIAGNOSIS — Z5321 Procedure and treatment not carried out due to patient leaving prior to being seen by health care provider: Secondary | ICD-10-CM | POA: Insufficient documentation

## 2021-01-18 DIAGNOSIS — R079 Chest pain, unspecified: Secondary | ICD-10-CM | POA: Insufficient documentation

## 2021-01-18 DIAGNOSIS — M542 Cervicalgia: Secondary | ICD-10-CM | POA: Diagnosis present

## 2021-01-18 LAB — I-STAT CHEM 8, ED
BUN: 5 mg/dL — ABNORMAL LOW (ref 6–20)
Calcium, Ion: 1.1 mmol/L — ABNORMAL LOW (ref 1.15–1.40)
Chloride: 104 mmol/L (ref 98–111)
Creatinine, Ser: 0.6 mg/dL (ref 0.44–1.00)
Glucose, Bld: 91 mg/dL (ref 70–99)
HCT: 38 % (ref 36.0–46.0)
Hemoglobin: 12.9 g/dL (ref 12.0–15.0)
Potassium: 3.8 mmol/L (ref 3.5–5.1)
Sodium: 138 mmol/L (ref 135–145)
TCO2: 23 mmol/L (ref 22–32)

## 2021-01-18 LAB — I-STAT BETA HCG BLOOD, ED (MC, WL, AP ONLY): I-stat hCG, quantitative: <5 m[IU]/mL (ref <5)

## 2021-01-18 NOTE — ED Notes (Signed)
PT called for vitals as well as for CT with no response. Will attempt again shortly.
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
## 2021-01-18 NOTE — ED Notes (Signed)
Pt called for vitals x2, no response. 

## 2021-02-19 ENCOUNTER — Encounter (HOSPITAL_COMMUNITY): Payer: Self-pay | Admitting: Psychiatry

## 2021-02-19 ENCOUNTER — Telehealth (INDEPENDENT_AMBULATORY_CARE_PROVIDER_SITE_OTHER): Payer: No Payment, Other | Admitting: Psychiatry

## 2021-02-19 DIAGNOSIS — F4310 Post-traumatic stress disorder, unspecified: Secondary | ICD-10-CM | POA: Diagnosis not present

## 2021-02-19 DIAGNOSIS — F331 Major depressive disorder, recurrent, moderate: Secondary | ICD-10-CM

## 2021-02-19 DIAGNOSIS — F411 Generalized anxiety disorder: Secondary | ICD-10-CM

## 2021-02-19 MED ORDER — PRAZOSIN HCL 1 MG PO CAPS: 1.0000 mg | ORAL_CAPSULE | Freq: Every day | ORAL | 3 refills | Status: DC

## 2021-02-19 MED ORDER — BUSPIRONE HCL 10 MG PO TABS: 10.0000 mg | ORAL_TABLET | Freq: Three times a day (TID) | ORAL | 3 refills | Status: DC

## 2021-02-19 MED ORDER — ESCITALOPRAM OXALATE 20 MG PO TABS: 20.0000 mg | ORAL_TABLET | Freq: Every day | ORAL | 3 refills | Status: DC

## 2021-02-19 MED ORDER — HYDROXYZINE HCL 10 MG PO TABS: 10.0000 mg | ORAL_TABLET | Freq: Three times a day (TID) | ORAL | 3 refills | Status: DC | PRN

## 2021-02-19 MED ORDER — TRAZODONE HCL 100 MG PO TABS: ORAL_TABLET | ORAL | 3 refills | Status: DC

## 2021-02-19 NOTE — Progress Notes (Signed)
BH MD/PA/NP OP Progress Note Virtual Visit via Telephone Note  I connected with Martha Horneranielle N Lopez on 02/19/21 at  8:00 AM EST by telephone and verified that I am speaking with the correct person using two identifiers.  Location: Patient: home Provider: Clinic   I discussed the limitations, risks, security and privacy concerns of performing an evaluation and management service by telephone and the availability of in person appointments. I also discussed with the patient that there may be a patient responsible charge related to this service. The patient expressed understanding and agreed to proceed.   I provided 30 minutes of non-face-to-face time during this encounter.  02/19/2021 7:54 AM Martha Horneranielle N Lopez  MRN:  [id number]  Chief Complaint: "My body have gotten use the meds"  HPI: 31 year old female seen today for follow-up psychiatric evaluation.  She has a psychiatric history of depression, anxiety, and PTSD.  She is currently managed on Lexapro 20 mg daily, trazodone 50 mg nightly, prazosin 1 mg nightly, and hydroxyzine 10 mg 3 times daily.  She notes her medications are somewhat effective in managing her psychiatric conditions.  Today patient was unable to logon virtually so assessment was done over the phone.  During exam she was pleasant, cooperative, and engaged in conversation.  Patient informed Clinical research associatewriter that she believes that her body has gotten used to the medications.  Recently she notes that she has been more anxious and depressed.  She informed Clinical research associatewriter that she started a new relationship in October with an older female.  She informed Clinical research associatewriter that at times she feels like she is buying his love and becomes overwhelmed when they have disagreements.  She informed Clinical research associatewriter that she over thinks there disagreements and has had to leave work on several occasion because of panic attacks induced by thoughts of their relationship.  She also notes that she feels like she is not as successful as her siblings  who are no longer living with her mother as she is.  Patient reports that her home life is somewhat stressful.  Today provider conducted a GAD-7 and patient scored a 19.  Provider also conducted PHQ-9 and patient scored an 18.  She reports that she sleeps approximately 5 hours nightly as she wakes up during the night.  She informed Clinical research associatewriter that prazosin has been effective in managing nightmares of past trauma.  She informed Clinical research associatewriter that her appetite fluctuates noting that it is poor sometimes and increased when she is depressed.  Today she denies SI/HI/VAH, paranoia, or mania.  Patient notes at times she is irritable however notes that it is an internal irritability.  She informed Clinical research associatewriter that she tries to drive to a park and notes that she yells at GOD fom making her the way she is.  Provider encouraged patient to use sensory techniques while at work /home to help alleviate her anxieties.  Provider also suggesting deep breathing techniques and exercise.  Provider recommended patient seeing therapist to help cope with above stressors however she notes that at this time she is not interested.  Today she is agreeable to starting BuSpar 10 mg 3 times daily to help manage anxiety and depression.  Trazodone increased from 50 mg to 100 mg as needed to help manage sleep.  Provider recommended therapy however patient was not agreeable.  She will continue all other medications as prescribed. Visit Diagnosis: No diagnosis found.  Past Psychiatric History: Depression and anxiety  Past Medical History:  Past Medical History:  Diagnosis Date   Anxiety  Anxiety    Depression    Depression    Diabetes mellitus without complication (HCC)    Epistaxis    Migraines    No past surgical history on file.  Family Psychiatric History: Mother depression and maternal grandmother schizophrenia, anxiety, and depression  Family History:  Family History  Problem Relation Age of Onset   Hypertension Mother    Gout  Father     Social History:  Social History   Socioeconomic History   Marital status: Single    Spouse name: Not on file   Number of children: Not on file   Years of education: Not on file   Highest education level: Not on file  Occupational History   Not on file  Tobacco Use   Smoking status: Never   Smokeless tobacco: Never  Vaping Use   Vaping Use: Never used  Substance and Sexual Activity   Alcohol use: No   Drug use: No   Sexual activity: Never  Other Topics Concern   Not on file  Social History Narrative   Not on file   Social Determinants of Health   Financial Resource Strain: Not on file  Food Insecurity: Not on file  Transportation Needs: Not on file  Physical Activity: Not on file  Stress: Not on file  Social Connections: Not on file    Allergies: No Known Allergies  Metabolic Disorder Labs: No results found for: HGBA1C, MPG No results found for: PROLACTIN No results found for: CHOL, TRIG, HDL, CHOLHDL, VLDL, LDLCALC No results found for: TSH  Therapeutic Level Labs: No results found for: LITHIUM No results found for: VALPROATE No components found for:  CBMZ  Current Medications: Current Outpatient Medications  Medication Sig Dispense Refill   acetaminophen (TYLENOL) 500 MG tablet Take 1,000 mg by mouth every 6 (six) hours as needed for mild pain.     escitalopram (LEXAPRO) 20 MG tablet TAKE 1 TABLET(20 MG) BY MOUTH DAILY (Patient taking differently: Take 20 mg by mouth in the morning and at bedtime.) 30 tablet 2   hydrocortisone (ANUSOL-HC) 2.5 % rectal cream Place 1 application rectally 2 (two) times daily. 30 g 0   hydrOXYzine (ATARAX/VISTARIL) 10 MG tablet Take 1 tablet (10 mg total) by mouth 3 (three) times daily as needed. 90 tablet 2   metFORMIN (GLUCOPHAGE-XR) 750 MG 24 hr tablet Take 750 mg by mouth 2 (two) times daily.     Multiple Vitamins-Minerals (MULTIVITAMIN ADULT) CHEW Chew 1 tablet by mouth daily.     OZEMPIC, 0.25 OR 0.5 MG/DOSE,  2 MG/1.5ML SOPN Inject 1 mg into the skin as directed. Take on Wednesday     prazosin (MINIPRESS) 1 MG capsule Take 1 capsule (1 mg total) by mouth at bedtime. (Patient taking differently: Take 1 mg by mouth 2 (two) times a week.) 30 capsule 2   tiZANidine (ZANAFLEX) 2 MG tablet Take 2 mg by mouth every 8 (eight) hours as needed for muscle spasms.     traZODone (DESYREL) 50 MG tablet TAKE 1 TO 2 TABLETS(50 TO 100 MG) BY MOUTH AT BEDTIME AS NEEDED FOR SLEEP 60 tablet 2   No current facility-administered medications for this visit.     Musculoskeletal: Strength & Muscle Tone:  Unable to assess due to telehealth visit Gait & Station:  Unable to assess due to telehealth visit Patient leans: N/A  Psychiatric Specialty Exam: Review of Systems  There were no vitals taken for this visit.There is no height or weight on file to  calculate BMI.  General Appearance: Unable to assess due to telehealth visit  Eye Contact:   Unable to assess due to telehealth visit  Speech:  Clear and Coherent and Normal Rate  Volume:  Normal  Mood:  Anxious and Depressed  Affect:  Unable to assess due to telehealth visit  Thought Process:  Coherent, Goal Directed, and Linear  Orientation:  Full (Time, Place, and Person)  Thought Content: WDL and Logical   Suicidal Thoughts:  No  Homicidal Thoughts:  No  Memory:  Immediate;   Good Recent;   Good Remote;   Good  Judgement:  Good  Insight:  Good  Psychomotor Activity:  Normal  Concentration:  Concentration: Good and Attention Span: Good  Recall:  Good  Fund of Knowledge: Good  Language: Good  Akathisia:  Unable to assess due to telehealth visit  Handed:  Right  AIMS (if indicated): not done  Assets:  Communication Skills Desire for Improvement Financial Resources/Insurance Housing Intimacy Physical Health Social Support  ADL's:  Intact  Cognition: WNL  Sleep:  Fair   Screenings: GAD-7    Advertising copywriter from 03/10/2020 in [facility]  Total GAD-7 Score 4      PHQ2-9    Flowsheet Row Counselor [facility]/10/2020 in [facility] Nutrition [facility] 02/25/2020 in [facility]  PHQ-2 Total Score 1 0  PHQ-9 Total Score 4 --      Flowsheet Row ED from 01/18/2021 in [facility] EMERGENCY DEPARTMENT  C-SSRS RISK CATEGORY No Risk        Assessment and Plan: Patient endorses symptoms of anxiety, depression, and insomnia.  Today she is agreeable to starting BuSpar 10 mg 3 times daily to help manage anxiety and depression.  Trazodone increased from 50 mg to 100 mg as needed to help manage sleep.  Provider recommended therapy however patient was not agreeable.  She will continue all other medications as prescribed.  1. Moderate episode of recurrent major depressive disorder (HCC)  Continue- escitalopram (LEXAPRO) 20 MG tablet; Take 1 tablet (20 mg total) by mouth daily. TAKE 1 TABLET(20 MG) BY MOUTH DAILY Strength: 20 mg  Dispense: 30 tablet; Refill: 3 Increase- traZODone (DESYREL) 100 MG tablet; TAKE 1 TO 2 TABLETS(50 TO 100 MG) BY MOUTH AT BEDTIME AS NEEDED FOR SLEEP  Dispense: 30 tablet; Refill: 3 Start- busPIRone (BUSPAR) 10 MG tablet; Take 1 tablet (10 mg total) by mouth 3 (three) times daily.  Dispense: 90 tablet; Refill: 3  2. Generalized anxiety disorder  Continue- escitalopram (LEXAPRO) 20 MG tablet; Take 1 tablet (20 mg total) by mouth daily. TAKE 1 TABLET(20 MG) BY MOUTH DAILY Strength: 20 mg  Dispense: 30 tablet; Refill: 3 Start- busPIRone (BUSPAR) 10 MG tablet; Take 1 tablet (10 mg total) by mouth 3 (three) times daily.  Dispense: 90 tablet; Refill: 3  3. PTSD (post-traumatic stress disorder)  Continue- hydrOXYzine (ATARAX) 10 MG tablet; Take 1 tablet (10 mg total) by mouth 3 (three) times daily as needed.  Dispense: 90 tablet; Refill: 3 Continue- prazosin (MINIPRESS) 1 MG capsule; Take 1 capsule (1 mg total) by mouth at  bedtime.  Dispense: 30 capsule; Refill: 3  Follow-up in 3 months  Shanna Cisco, NP 02/19/2021, 7:54 AM

## 2021-03-03 ENCOUNTER — Emergency Department (HOSPITAL_COMMUNITY): Admission: EM | Admit: 2021-03-03 | Discharge: 2021-03-03 | Discharge status: Against Medical Advice | Payer: Self-pay

## 2021-03-03 NOTE — ED Notes (Signed)
PT called for triage X3 with no response. Will attempt again shortly °

## 2021-03-03 NOTE — ED Notes (Signed)
PT called multiple times for triage °

## 2021-03-03 NOTE — ED Notes (Signed)
Called for in lobby, no response

## 2021-04-27 ENCOUNTER — Telehealth (HOSPITAL_COMMUNITY): Payer: No Payment, Other | Admitting: Psychiatry

## 2021-05-05 ENCOUNTER — Telehealth (HOSPITAL_COMMUNITY): Payer: No Payment, Other | Admitting: Psychiatry

## 2021-05-24 ENCOUNTER — Ambulatory Visit: Payer: Self-pay | Admitting: *Deleted

## 2021-05-24 NOTE — Telephone Encounter (Signed)
?  Chief Complaint: Vaginal Discharge ?Symptoms: clear discharge  after unprotected intercourse 05/18/21, abdominal cramping ?Frequency: 05/24/21 ?Pertinent Negatives: Patient denies  ?Disposition: [] ED /[] Urgent Care (no appt availability in office) / [] Appointment(In office/virtual)/ []  [facility]/ [] Home Care/ [] Refused Recommended Disposition /[]  Mobile Bus/ [x]  Follow-up with PCP ?Additional Notes: Advised to alert PCP. Also advised may use [facility] as resource for testing. Pt verbalizes understanding.  ? Reason for Disposition ? Patient is worried they have a sexually transmitted infection (STI) ? ?Answer Assessment - Initial Assessment Questions ?1. DISCHARGE: "Describe the discharge." (e.g., white, yellow, green, gray, foamy, cottage cheese-like) ?    Clear ?2. ODOR: "Is there a bad odor?" ?    No ?3. ONSET: "When did the discharge begin?" ?    This AM ?4. RASH: "Is there a rash in that area?" If Yes, ask: "Describe it." (e.g., redness, blisters, sores, bumps) ?    *No Answer* ?5. ABDOMINAL PAIN: "Are you having any abdominal pain?" If Yes, ask: "What does it feel like? " (e.g., crampy, dull, intermittent, constant)  ?    Mild cramping ?6. ABDOMINAL PAIN SEVERITY: If present, ask: "How bad is it?"  (e.g., mild, moderate, severe) ? - MILD - doesn't interfere with normal activities  ? - MODERATE - interferes with normal activities or awakens from sleep  ? - SEVERE - patient doesn't want to move (R/O peritonitis)  ?    mild ?7. CAUSE: "What do you think is causing the discharge?" "Have you had the same problem before? What happened then?" ?    unsure ?8. OTHER SYMPTOMS: "Do you have any other symptoms?" (e.g., fever, itching, vaginal bleeding, pain with urination, injury to genital area, vaginal foreign body) ?    Abdominal cramping. ?9. PREGNANCY: "Is there any chance you are pregnant?" "When was your last menstrual period?" ?    05/04/21 ? ?Protocols used: Vaginal Discharge-A-AH ? ?

## 2021-07-09 ENCOUNTER — Encounter (HOSPITAL_COMMUNITY): Payer: Self-pay | Admitting: Psychiatry

## 2021-07-09 ENCOUNTER — Telehealth (INDEPENDENT_AMBULATORY_CARE_PROVIDER_SITE_OTHER): Payer: No Payment, Other | Admitting: Psychiatry

## 2021-07-09 DIAGNOSIS — F3181 Bipolar II disorder: Secondary | ICD-10-CM | POA: Insufficient documentation

## 2021-07-09 DIAGNOSIS — F411 Generalized anxiety disorder: Secondary | ICD-10-CM

## 2021-07-09 DIAGNOSIS — F4310 Post-traumatic stress disorder, unspecified: Secondary | ICD-10-CM | POA: Diagnosis not present

## 2021-07-09 MED ORDER — LAMOTRIGINE 25 MG PO TABS: 25.0000 mg | ORAL_TABLET | Freq: Every day | ORAL | 1 refills | Status: DC

## 2021-07-09 MED ORDER — BUSPIRONE HCL 10 MG PO TABS: 10.0000 mg | ORAL_TABLET | Freq: Three times a day (TID) | ORAL | 3 refills | Status: DC

## 2021-07-09 MED ORDER — ESCITALOPRAM OXALATE 20 MG PO TABS: 20.0000 mg | ORAL_TABLET | Freq: Every day | ORAL | 3 refills | Status: DC

## 2021-07-09 MED ORDER — HYDROXYZINE HCL 25 MG PO TABS: 25.0000 mg | ORAL_TABLET | Freq: Three times a day (TID) | ORAL | 3 refills | Status: DC | PRN

## 2021-07-09 MED ORDER — TRAZODONE HCL 300 MG PO TABS: 300.0000 mg | ORAL_TABLET | Freq: Every evening | ORAL | 3 refills | Status: DC | PRN

## 2021-07-09 MED ORDER — PRAZOSIN HCL 2 MG PO CAPS: 2.0000 mg | ORAL_CAPSULE | Freq: Every day | ORAL | 3 refills | Status: DC

## 2021-08-20 ENCOUNTER — Encounter (HOSPITAL_COMMUNITY): Payer: Self-pay | Admitting: Psychiatry

## 2021-08-20 ENCOUNTER — Telehealth (INDEPENDENT_AMBULATORY_CARE_PROVIDER_SITE_OTHER): Payer: No Payment, Other | Admitting: Psychiatry

## 2021-08-20 ENCOUNTER — Other Ambulatory Visit: Payer: Self-pay

## 2021-08-20 DIAGNOSIS — F3181 Bipolar II disorder: Secondary | ICD-10-CM | POA: Diagnosis not present

## 2021-08-20 DIAGNOSIS — F4310 Post-traumatic stress disorder, unspecified: Secondary | ICD-10-CM | POA: Diagnosis not present

## 2021-08-20 DIAGNOSIS — F411 Generalized anxiety disorder: Secondary | ICD-10-CM | POA: Diagnosis not present

## 2021-08-20 MED ORDER — BUSPIRONE HCL 10 MG PO TABS
10.0000 mg | ORAL_TABLET | Freq: Three times a day (TID) | ORAL | 3 refills | Status: DC
  Filled 2021-08-20 – 2021-10-11 (×2): qty 90, 30d supply, fill #0

## 2021-08-20 MED ORDER — PRAZOSIN HCL 2 MG PO CAPS
2.0000 mg | ORAL_CAPSULE | Freq: Every day | ORAL | 3 refills | Status: DC
  Filled 2021-08-20 – 2021-10-01 (×2): qty 30, 30d supply, fill #0
  Filled 2021-10-11: qty 28, 28d supply, fill #0
  Filled 2021-10-11: qty 2, 2d supply, fill #0

## 2021-08-20 MED ORDER — TRAZODONE HCL 300 MG PO TABS: 300.0000 mg | ORAL_TABLET | Freq: Every evening | ORAL | 3 refills | Status: DC | PRN

## 2021-08-20 MED ORDER — ESCITALOPRAM OXALATE 20 MG PO TABS: 20.0000 mg | ORAL_TABLET | Freq: Every day | ORAL | 3 refills | Status: DC

## 2021-08-20 MED ORDER — HYDROXYZINE HCL 25 MG PO TABS: 25.0000 mg | ORAL_TABLET | Freq: Three times a day (TID) | ORAL | 3 refills | Status: DC | PRN

## 2021-08-20 MED ORDER — LAMOTRIGINE 100 MG PO TABS
100.0000 mg | ORAL_TABLET | Freq: Every day | ORAL | 3 refills | Status: DC
  Filled 2021-08-20 – 2021-10-11 (×3): qty 30, 30d supply, fill #0

## 2021-08-20 MED ORDER — HYDROXYZINE HCL 25 MG PO TABS
25.0000 mg | ORAL_TABLET | Freq: Three times a day (TID) | ORAL | 3 refills | Status: DC | PRN
  Filled 2021-08-20: qty 90, 30d supply, fill #0

## 2021-08-20 MED ORDER — BUSPIRONE HCL 10 MG PO TABS: 10.0000 mg | ORAL_TABLET | Freq: Three times a day (TID) | ORAL | 3 refills | Status: DC

## 2021-08-20 MED ORDER — LAMOTRIGINE 100 MG PO TABS: 100.0000 mg | ORAL_TABLET | Freq: Every day | ORAL | 3 refills | Status: DC

## 2021-08-20 MED ORDER — PRAZOSIN HCL 2 MG PO CAPS: 2.0000 mg | ORAL_CAPSULE | Freq: Every day | ORAL | 3 refills | Status: DC

## 2021-08-20 MED ORDER — TRAZODONE HCL 300 MG PO TABS
300.0000 mg | ORAL_TABLET | Freq: Every evening | ORAL | 3 refills | Status: DC | PRN
  Filled 2021-08-20 – 2021-10-11 (×2): qty 30, 30d supply, fill #0

## 2021-08-20 MED ORDER — ESCITALOPRAM OXALATE 20 MG PO TABS
20.0000 mg | ORAL_TABLET | Freq: Every day | ORAL | 3 refills | Status: DC
  Filled 2021-08-20 – 2021-10-11 (×2): qty 30, 30d supply, fill #0

## 2021-08-20 NOTE — Progress Notes (Signed)
BH MD/PA/NP OP Progress Note Virtual Visit via Video Note  I connected with Martha Lopez on 08/20/21 at 11:00 AM EDT by a video enabled telemedicine application and verified that I am speaking with the correct person using two identifiers.  Location: Patient: Home Provider: Clinic   I discussed the limitations of evaluation and management by telemedicine and the availability of in person appointments. The patient expressed understanding and agreed to proceed.  I provided 30 minutes of non-face-to-face time during this encounter.   08/20/2021 11:08 AM Martha Lopez  MRN:  [id number]  Chief Complaint: "I feel better"   HPI: 31 year old female seen today for follow-up psychiatric evaluation.  She has a psychiatric history of depression, anxiety, and PTSD.  She is currently managed on Lexapro 20 mg daily,  trazodone 300 nightly as needed, prazosin 2 mg nightly, Buspar 10 mg three times daily, Lamictal 75 mg daily, and hydroxyzine 25 mg 3 times daily.  She notes her medications are effective in managing her psychiatric conditions.  Today she was well groomed, pleasant, cooperative, engaged in conversation, and maintained eye contact.  Patient informed Clinical research associate that she been feeling a lot better.  She notes that with the increase in trazodone she is sleeping soundly at night and does not have as many nightmares.  She also reports that she no longer is experiencing hypomania.  Currently her mood is stable and she reports that she has minimal anxiety and depression.  Provider conducted a GAD-7 and patient scored a 10, at her last visit she scored an 18.  Provider also conducted PHQ-9 and patient scored a 4, at her last visit she scored a 21.  Today she denies SI/HI/VAH or paranoia.   Today patient agreeable to increasing Lamictal 75 mg to 100 mg to help manage mood.  She will continue all other medications as prescribed.  Patient reports she has difficulty paying for medications.  Medication sent to  [facility].  No other concerns at this time.    Visit Diagnosis:    ICD-10-CM   1. Generalized anxiety disorder  F41.1 busPIRone (BUSPAR) 10 MG tablet    escitalopram (LEXAPRO) 20 MG tablet    trazodone (DESYREL) 300 MG tablet    DISCONTINUED: busPIRone (BUSPAR) 10 MG tablet    DISCONTINUED: escitalopram (LEXAPRO) 20 MG tablet    DISCONTINUED: trazodone (DESYREL) 300 MG tablet    2. PTSD (post-traumatic stress disorder)  F43.10 hydrOXYzine (ATARAX) 25 MG tablet    prazosin (MINIPRESS) 2 MG capsule    DISCONTINUED: hydrOXYzine (ATARAX) 25 MG tablet    DISCONTINUED: prazosin (MINIPRESS) 2 MG capsule    3. Bipolar 2 disorder, major depressive episode (HCC)  F31.81 lamoTRIgine (LAMICTAL) 100 MG tablet    trazodone (DESYREL) 300 MG tablet    DISCONTINUED: lamoTRIgine (LAMICTAL) 100 MG tablet    DISCONTINUED: trazodone (DESYREL) 300 MG tablet      Past Psychiatric History: Depression and anxiety  Past Medical History:  Past Medical History:  Diagnosis Date   Anxiety    Anxiety    Depression    Depression    Diabetes mellitus without complication (HCC)    Epistaxis    Migraines    No past surgical history on file.  Family Psychiatric History: Mother depression and maternal grandmother schizophrenia, anxiety, and depression  Family History:  Family History  Problem Relation Age of Onset   Hypertension Mother    Gout Father     Social History:  Social History  Socioeconomic History   Marital status: Single    Spouse name: Not on file   Number of children: Not on file   Years of education: Not on file   Highest education level: Not on file  Occupational History   Not on file  Tobacco Use   Smoking status: Never   Smokeless tobacco: Never  Vaping Use   Vaping Use: Never used  Substance and Sexual Activity   Alcohol use: No   Drug use: No   Sexual activity: Never  Other Topics Concern   Not on file  Social History Narrative   Not on file    Social Determinants of Health   Financial Resource Strain: Not on file  Food Insecurity: Not on file  Transportation Needs: Not on file  Physical Activity: Not on file  Stress: Not on file  Social Connections: Not on file    Allergies: No Known Allergies  Metabolic Disorder Labs: No results found for: "HGBA1C", "MPG" No results found for: "PROLACTIN" No results found for: "CHOL", "TRIG", "HDL", "CHOLHDL", "VLDL", "LDLCALC" No results found for: "TSH"  Therapeutic Level Labs: No results found for: "LITHIUM" No results found for: "VALPROATE" No results found for: "CBMZ"  Current Medications: Current Outpatient Medications  Medication Sig Dispense Refill   acetaminophen (TYLENOL) 500 MG tablet Take 1,000 mg by mouth every 6 (six) hours as needed for mild pain.     busPIRone (BUSPAR) 10 MG tablet Take 1 tablet (10 mg total) by mouth 3 (three) times daily. 90 tablet 3   escitalopram (LEXAPRO) 20 MG tablet Take 1 tablet (20 mg total) by mouth daily. 30 tablet 3   hydrocortisone (ANUSOL-HC) 2.5 % rectal cream Place 1 application rectally 2 (two) times daily. 30 g 0   hydrOXYzine (ATARAX) 25 MG tablet Take 1 tablet (25 mg total) by mouth 3 (three) times daily as needed. 90 tablet 3   lamoTRIgine (LAMICTAL) 100 MG tablet Take 1 tablet (100 mg total) by mouth daily. Take one tablet (100mg ) daily 30 tablet 3   metFORMIN (GLUCOPHAGE-XR) 750 MG 24 hr tablet Take 750 mg by mouth 2 (two) times daily.     Multiple Vitamins-Minerals (MULTIVITAMIN ADULT) CHEW Chew 1 tablet by mouth daily.     OZEMPIC, 0.25 OR 0.5 MG/DOSE, 2 MG/1.5ML SOPN Inject 1 mg into the skin as directed. Take on Wednesday     prazosin (MINIPRESS) 2 MG capsule Take 1 capsule (2 mg total) by mouth at bedtime. 30 capsule 3   tiZANidine (ZANAFLEX) 2 MG tablet Take 2 mg by mouth every 8 (eight) hours as needed for muscle spasms.     trazodone (DESYREL) 300 MG tablet Take 1 tablet (300 mg total) by mouth at bedtime as needed  for sleep. 30 tablet 3   No current facility-administered medications for this visit.     Musculoskeletal: Strength & Muscle Tone: within normal limits and telehealth visit Gait & Station: normal, telehealth visit Patient leans: N/A  Psychiatric Specialty Exam: Review of Systems  There were no vitals taken for this visit.There is no height or weight on file to calculate BMI.  General Appearance: Well groomed  Eye Contact:  Good  Speech:  Clear and Coherent and Normal Rate  Volume:  Normal  Mood:  Euthymic  Affect:  appropriate and congruent   Thought Process:  Coherent, Goal Directed, and Linear  Orientation:  Full (Time, Place, and Person)  Thought Content: WDL and Logical   Suicidal Thoughts:  No  Homicidal Thoughts:  No  Memory:  Immediate;   Good Recent;   Good Remote;   Good  Judgement:  Good  Insight:  Good  Psychomotor Activity:  Normal  Concentration:  Concentration: Good and Attention Span: Good  Recall:  Good  Fund of Knowledge: Good  Language: Good  Akathisia:  No  Handed:  Right  AIMS (if indicated): not done  Assets:  Communication Skills Desire for Improvement Financial Resources/Insurance Housing Intimacy Physical Health Social Support  ADL's:  Intact  Cognition: WNL  Sleep:  Good   Screenings: GAD-7    Flowsheet Row Video Visit from 08/20/2021 in [facility] Video Visit from 07/09/2021 in [facility] Video Visit from 02/19/2021 in [facility] Counselor from 03/10/2020 in [facility]  Total GAD-7 Score 10 18 19 4       PHQ2-9    Flowsheet Row Video Visit from 08/20/2021 in [facility] Video Visit from 07/09/2021 in [facility] Video Visit from 02/19/2021 in [facility] Counselor from 03/10/2020 in [facility] Nutrition from  02/25/2020 in [facility]  PHQ-2 Total Score 2 6 5 1  0  PHQ-9 Total Score 4 21 18 4  --      Flowsheet Row Video Visit from 07/09/2021 in [facility] ED from 01/18/2021 in [facility] EMERGENCY DEPARTMENT  C-SSRS RISK CATEGORY Error: Q7 should not be populated when Q6 is No No Risk        Assessment and Plan: Patient reports that her anxiety, mood, sleep, and depression has improved since her last visit.  Today patient agreeable to increasing Lamictal 75 mg to 100 mg to help manage mood.  She will continue all other medications as prescribed.  Patient reports she has difficulty paying for medications.  Medication sent to [facility].  1. Generalized anxiety disorder  Continue- busPIRone (BUSPAR) 10 MG tablet; Take 1 tablet (10 mg total) by mouth 3 (three) times daily.  Dispense: 90 tablet; Refill: 3 Continue- escitalopram (LEXAPRO) 20 MG tablet; Take 1 tablet (20 mg total) by mouth daily.  Dispense: 30 tablet; Refill: 3 Continue- trazodone (DESYREL) 300 MG tablet; Take 1 tablet (300 mg total) by mouth at bedtime as needed for sleep.  Dispense: 30 tablet; Refill: 3  2. PTSD (post-traumatic stress disorder)  Continue- hydrOXYzine (ATARAX) 25 MG tablet; Take 1 tablet (25 mg total) by mouth 3 (three) times daily as needed.  Dispense: 90 tablet; Refill: 3 Continue- prazosin (MINIPRESS) 2 MG capsule; Take 1 capsule (2 mg total) by mouth at bedtime.  Dispense: 30 capsule; Refill: 3  3. Bipolar 2 disorder, major depressive episode (HCC)  Increased- lamoTRIgine (LAMICTAL) 100 MG tablet; Take 1 tablet (100 mg total) by mouth daily. Take one tablet (100mg ) daily  Dispense: 30 tablet; Refill: 3 Continue- trazodone (DESYREL) 300 MG tablet; Take 1 tablet (300 mg total) by mouth at bedtime as needed for sleep.  Dispense: 30 tablet; Refill: 3    Follow-up in 3 months  03/18/2021, NP 08/20/2021, 11:08 AM

## 2021-08-26 ENCOUNTER — Other Ambulatory Visit: Payer: Self-pay

## 2021-09-07 ENCOUNTER — Telehealth (HOSPITAL_COMMUNITY): Payer: Self-pay | Admitting: Psychiatry

## 2021-09-07 ENCOUNTER — Other Ambulatory Visit (HOSPITAL_COMMUNITY): Payer: Self-pay | Admitting: Psychiatry

## 2021-09-07 ENCOUNTER — Other Ambulatory Visit: Payer: Self-pay

## 2021-09-07 DIAGNOSIS — F4310 Post-traumatic stress disorder, unspecified: Secondary | ICD-10-CM

## 2021-09-07 MED ORDER — HYDROXYZINE HCL 50 MG PO TABS
50.0000 mg | ORAL_TABLET | Freq: Three times a day (TID) | ORAL | 3 refills | Status: DC | PRN
  Filled 2021-09-07 – 2021-10-11 (×2): qty 90, 30d supply, fill #0

## 2021-09-07 NOTE — Telephone Encounter (Signed)
Patient reports that she had a bad night. She notes that she has not been sleeping since the break up with her girlfriend last week. She reports that she felt secure being told that she was loved but now she does not believe that her girlfriend will comeback to her. She reports that she believes her girlfriend is dating someone new. She reports she feels overwhelmed and unsupported by family. She informed Clinical research associate that her mother shares her mental health conditions with other family members, her brother calls her a drug dealer, and her family does not invite her to social events. 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 To cope patient notes that she has smoked marijuana, drank alcohol, prays, and go to church.  Patient reports the above stressors exacerbates her anxiety and depression.  Today she is agreeable to increasing hydroxyzine 25 mg 3 times daily to 50 mg 3 times daily to help manage sleep and anxiety.  Patient referred to outpatient counseling for therapy.  No other concerns at this time.

## 2021-09-14 ENCOUNTER — Other Ambulatory Visit: Payer: Self-pay

## 2021-09-30 ENCOUNTER — Telehealth (HOSPITAL_COMMUNITY): Payer: Self-pay | Admitting: *Deleted

## 2021-09-30 ENCOUNTER — Other Ambulatory Visit: Payer: Self-pay

## 2021-09-30 ENCOUNTER — Other Ambulatory Visit (HOSPITAL_COMMUNITY): Payer: Self-pay | Admitting: Psychiatry

## 2021-09-30 MED ORDER — RISPERIDONE 1 MG PO TABS
1.0000 mg | ORAL_TABLET | Freq: Every day | ORAL | 3 refills | Status: DC
  Filled 2021-09-30 – 2021-10-11 (×2): qty 30, 30d supply, fill #0

## 2021-09-30 NOTE — Telephone Encounter (Signed)
VM from patient stating she was told by Dr Doyne Keel that if she needed to talk to call her. She is her provider. She would like a call back though she did not indicate what is was regarding. Will forward her request to Dr Doyne Keel.

## 2021-09-30 NOTE — Telephone Encounter (Signed)
Patient informed writer that for the last few days she has not slept.  She also informed Clinical research associate that she has been more paranoid.  She notes that she feels like people are following her.  She also informed Clinical research associate that she hears knocking.  She informed Clinical research associate that she would check the door nothing will be there.  She reports that at times she has bizarre behaviors.  She informed Clinical research associate that recently her mother texted her that she should not use guns while playing call of duty videogame.  She notes that when she saw this message from her mother she had a behavioral outburst at work where she was crying, banging on her desk, and screaming.  She informed Clinical research associate that her employers are concerned about her mental health.  Today she is agreeable to starting Risperdal 1 mg nightly to help manage her psychosis, mood, and sleep.  She will continue trazodone to help manage sleep.Potential side effects of medication and risks vs benefits of treatment vs non-treatment were explained and discussed. All questions were answered. No other concerns noted at this time.

## 2021-10-01 ENCOUNTER — Other Ambulatory Visit: Payer: Self-pay

## 2021-10-05 ENCOUNTER — Other Ambulatory Visit: Payer: Self-pay

## 2021-10-07 ENCOUNTER — Other Ambulatory Visit: Payer: Self-pay

## 2021-10-08 ENCOUNTER — Other Ambulatory Visit: Payer: Self-pay

## 2021-10-11 ENCOUNTER — Other Ambulatory Visit: Payer: Self-pay

## 2021-10-13 ENCOUNTER — Other Ambulatory Visit: Payer: Self-pay

## 2021-10-13 ENCOUNTER — Telehealth (HOSPITAL_COMMUNITY): Payer: Self-pay | Admitting: Psychiatry

## 2021-10-13 ENCOUNTER — Other Ambulatory Visit (HOSPITAL_COMMUNITY): Payer: Self-pay | Admitting: Psychiatry

## 2021-10-13 MED ORDER — ZOLPIDEM TARTRATE 5 MG PO TABS: 5.0000 mg | ORAL_TABLET | Freq: Every evening | ORAL | 2 refills | Status: DC | PRN

## 2021-10-13 MED ORDER — RISPERIDONE 2 MG PO TABS: 2.0000 mg | ORAL_TABLET | Freq: Every day | ORAL | 3 refills | Status: DC

## 2021-10-13 NOTE — Telephone Encounter (Signed)
Patient informed Probation officer that recently she had a behavioral outburst at work.  She notes that her supervisor was dissatisfied with her and her fellow coworkers and was reprimanding them.  She informed Probation officer that she began to pick at her skin and then fell to the floor and started crying.  Patient notes that she has not slept in 2 days.  She also informed Probation officer that she is irritable, distractible, and having racing thoughts.  At times she notes that she hears demonic voices and sees shadows.  Today she is agreeable to increasing Risperdal 1 mg nightly to 2 mg nightly to help manage mood and psychosis.  She is also agreeable to starting Ambien 5 mg nightly to help manage sleep. Potential side effects of medication and risks vs benefits of treatment vs non-treatment were explained and discussed. All questions were answered.  No other concerns at this time.

## 2021-10-14 ENCOUNTER — Other Ambulatory Visit (HOSPITAL_COMMUNITY): Payer: Self-pay | Admitting: Psychiatry

## 2021-10-14 ENCOUNTER — Other Ambulatory Visit (HOSPITAL_BASED_OUTPATIENT_CLINIC_OR_DEPARTMENT_OTHER): Payer: Self-pay

## 2021-10-14 ENCOUNTER — Other Ambulatory Visit: Payer: Self-pay

## 2021-10-14 DIAGNOSIS — F3181 Bipolar II disorder: Secondary | ICD-10-CM

## 2021-10-14 DIAGNOSIS — F411 Generalized anxiety disorder: Secondary | ICD-10-CM

## 2021-10-14 MED ORDER — TRAZODONE HCL 300 MG PO TABS: 300.0000 mg | ORAL_TABLET | Freq: Every evening | ORAL | 3 refills | Status: DC | PRN

## 2021-10-14 MED ORDER — TRAZODONE HCL 150 MG PO TABS: 300.0000 mg | ORAL_TABLET | Freq: Every evening | ORAL | 3 refills | Status: DC | PRN

## 2021-10-18 ENCOUNTER — Other Ambulatory Visit: Payer: Self-pay

## 2021-10-18 ENCOUNTER — Other Ambulatory Visit (HOSPITAL_COMMUNITY): Payer: Self-pay | Admitting: Psychiatry

## 2021-10-18 ENCOUNTER — Telehealth (HOSPITAL_COMMUNITY): Payer: Self-pay | Admitting: Psychiatry

## 2021-10-18 DIAGNOSIS — G4700 Insomnia, unspecified: Secondary | ICD-10-CM

## 2021-10-18 MED ORDER — ZOLPIDEM TARTRATE 10 MG PO TABS: 10.0000 mg | ORAL_TABLET | Freq: Every evening | ORAL | 3 refills | Status: DC | PRN

## 2021-10-18 MED ORDER — RISPERIDONE 4 MG PO TABS: 4.0000 mg | ORAL_TABLET | Freq: Every day | ORAL | 3 refills | Status: DC

## 2021-10-18 NOTE — Telephone Encounter (Signed)
Patient informed Probation officer that she continues to have poor sleep.  She informed Probation officer that she has only been sleeping 2 hours nightly.  She informed Probation officer that she is anxious and paranoid.  She reports that she feels that her family members will leave her.  At times she notes that she is irritable, distractible, and has racing thoughts.  Patient went to court today and reports that she had a behavioral outburst at court.  Recently she had a behavioral outburst at work.  Patient is applying for disability.  Provider recommended increasing Risperdal to 4 mg nightly to help manage symptoms of paranoia, anxiety, and sleep.  Provider also recommended increasing Ambien 5 mg to 10 mg to help manage sleep.  Patient referred for a sleep study.  Provider spoke to patient's godmother Ms. Joya Gaskins as patient notes that she would like her informed of her care.  Ms. Joya Gaskins notes that currently she is not a danger to herself or others but notes that she has noticed changes in her behaviors.  She notes that she has not been sleeping has been irritable, distractible, and at times have gaps in her memory where she is unable to articulate fully was going on in her day.  Provider discussed current medication adjustments and recommended that patient come into [facility] if symptoms worsen.  She endorsed understanding and agreed.  No other concerns at this time.

## 2021-10-25 ENCOUNTER — Telehealth (HOSPITAL_COMMUNITY): Payer: Self-pay | Admitting: Psychiatry

## 2021-10-25 ENCOUNTER — Encounter (HOSPITAL_COMMUNITY): Payer: Self-pay | Admitting: Psychiatry

## 2021-10-25 NOTE — Telephone Encounter (Signed)
The patient engaged in a conversation with the healthcare provider regarding her present mental well-being and received a letter discussing the recomaendation for a mental break from work along with the potential paperwork for [place]. Patient verbally expressed understanding at end of call nothing further needed.

## 2021-10-25 NOTE — Telephone Encounter (Signed)
Patient informed Probation officer that she received a mental health evaluation for her job.  She notes that her social worker is recommending that she come out of work.  She asked Probation officer to write a letter recommending that she be allowed time off.  Provider was agreeable to this however patient asked if writer could write that she should not ever returned to work.  Provider informed patient that she cannot recommend that she quit her job and that she would have to make that decision for herself.  Provider did inform patient that if she chooses to continue working FMLA paperwork can be completed.  She endorsed understanding and agreed.  Mentally patient notes that she continues to struggle.  She informed Probation officer that she had an outburst at work today and had to leave.  She does note that since starting Ambien her sleep has somewhat improved.  She informed Probation officer that she got 6 hours of sleep last night but the night before only slept 3 hours.  Patient informed writer that when she only slept 3 hours she woke up angry and punched the wall.  She notes that now her hands are bruised.  She did Artist that she is able to move her hands and notes that it is no longer swollen.  Provider recommended ice, heat, and Tylenol as needed.  Patient is to see primary care doctor pain does not subside.  No other concerns at this time.

## 2021-10-31 ENCOUNTER — Other Ambulatory Visit: Payer: Self-pay

## 2021-10-31 ENCOUNTER — Emergency Department (HOSPITAL_COMMUNITY)
Admission: EM | Admit: 2021-10-31 | Discharge: 2021-10-31 | Discharge status: Against Medical Advice | Payer: Commercial Managed Care - HMO | Attending: Emergency Medicine | Admitting: Emergency Medicine

## 2021-10-31 ENCOUNTER — Emergency Department (HOSPITAL_COMMUNITY): Payer: Commercial Managed Care - HMO

## 2021-10-31 ENCOUNTER — Encounter (HOSPITAL_COMMUNITY): Payer: Self-pay

## 2021-10-31 DIAGNOSIS — R309 Painful micturition, unspecified: Secondary | ICD-10-CM | POA: Diagnosis not present

## 2021-10-31 DIAGNOSIS — R350 Frequency of micturition: Secondary | ICD-10-CM | POA: Diagnosis not present

## 2021-10-31 DIAGNOSIS — Z5321 Procedure and treatment not carried out due to patient leaving prior to being seen by health care provider: Secondary | ICD-10-CM | POA: Insufficient documentation

## 2021-10-31 DIAGNOSIS — R531 Weakness: Secondary | ICD-10-CM | POA: Diagnosis not present

## 2021-10-31 DIAGNOSIS — R079 Chest pain, unspecified: Secondary | ICD-10-CM | POA: Diagnosis present

## 2021-10-31 LAB — URINALYSIS, ROUTINE W REFLEX MICROSCOPIC
Bilirubin Urine: NEGATIVE
Glucose, UA: NEGATIVE mg/dL
Hgb urine dipstick: NEGATIVE
Ketones, ur: 20 mg/dL — AB
Nitrite: POSITIVE — AB
Protein, ur: NEGATIVE mg/dL
Specific Gravity, Urine: 1.019 (ref 1.005–1.030)
Trans Epithel, UA: 2
pH: 5 (ref 5.0–8.0)

## 2021-10-31 LAB — CBC
HCT: 40.1 % (ref 36.0–46.0)
Hemoglobin: 12.7 g/dL (ref 12.0–15.0)
MCH: 26 pg (ref 26.0–34.0)
MCHC: 31.7 g/dL (ref 30.0–36.0)
MCV: 82.2 fL (ref 80.0–100.0)
Platelets: 374 10*3/uL (ref 150–400)
RBC: 4.88 MIL/uL (ref 3.87–5.11)
RDW: 16.5 % — ABNORMAL HIGH (ref 11.5–15.5)
WBC: 11.3 10*3/uL — ABNORMAL HIGH (ref 4.0–10.5)
nRBC: 0 % (ref 0.0–0.2)

## 2021-10-31 LAB — BASIC METABOLIC PANEL
Anion gap: 11 (ref 5–15)
BUN: 11 mg/dL (ref 6–20)
CO2: 23 mmol/L (ref 22–32)
Calcium: 9.1 mg/dL (ref 8.9–10.3)
Chloride: 103 mmol/L (ref 98–111)
Creatinine, Ser: 0.76 mg/dL (ref 0.44–1.00)
GFR, Estimated: >60 mL/min (ref >60)
Glucose, Bld: 82 mg/dL (ref 70–99)
Potassium: 3.7 mmol/L (ref 3.5–5.1)
Sodium: 137 mmol/L (ref 135–145)

## 2021-10-31 LAB — TROPONIN I (HIGH SENSITIVITY): Troponin I (High Sensitivity): 2 ng/L (ref <18)

## 2021-10-31 LAB — HCG, QUANTITATIVE, PREGNANCY: hCG, Beta Chain, Quant, S: <1 m[IU]/mL (ref <5)

## 2021-10-31 LAB — CBG MONITORING, ED: Glucose-Capillary: 79 mg/dL (ref 70–99)

## 2021-10-31 NOTE — ED Notes (Signed)
Pt escorted to X-Ray

## 2021-10-31 NOTE — ED Triage Notes (Signed)
Pt c/o chest pain, weakness, no appetite, and urinary pain with frequency x6 days. Pt states she has started on new psych meds and thinks these symptoms may be side effects.

## 2021-10-31 NOTE — ED Provider Triage Note (Signed)
Emergency Medicine Provider Triage Evaluation Note  Martha Lopez , a 31 y.o. female  was evaluated in triage.  Pt complains of chest pain, weakness, and pain with urination for the past 4-7 days. She reports that she is on new psych medications and hasn't slept in 6 days.  Review of Systems  Positive:  Negative:   Physical Exam  BP 121/88 (BP Location: Left Arm)   Pulse 95   Temp 98.4 F (36.9 C) (Oral)   Resp 15   Ht 5\' 3"  (1.6 m)   Wt 72.6 kg   LMP 10/21/2021 (Approximate)   SpO2 100%   BMI 28.34 kg/m  Gen:   Awake, no distress   Resp:  Normal effort  MSK:   Moves extremities without difficulty  Other:    Medical Decision Making  Medically screening exam initiated at 12:33 PM.  Appropriate orders placed.  Martha Lopez was informed that the remainder of the evaluation will be completed by another provider, this initial triage assessment does not replace that evaluation, and the importance of remaining in the ED until their evaluation is complete.  Will order labs. Denies any HI or SI.   Martha Puller, PA-C 10/31/21 1234

## 2021-11-11 ENCOUNTER — Telehealth (INDEPENDENT_AMBULATORY_CARE_PROVIDER_SITE_OTHER): Payer: No Payment, Other | Admitting: Psychiatry

## 2021-11-11 ENCOUNTER — Other Ambulatory Visit: Payer: Self-pay

## 2021-11-11 ENCOUNTER — Encounter (HOSPITAL_COMMUNITY): Payer: Self-pay

## 2021-11-11 ENCOUNTER — Encounter (HOSPITAL_COMMUNITY): Payer: Self-pay | Admitting: Psychiatry

## 2021-11-11 DIAGNOSIS — F4310 Post-traumatic stress disorder, unspecified: Secondary | ICD-10-CM | POA: Diagnosis not present

## 2021-11-11 DIAGNOSIS — F250 Schizoaffective disorder, bipolar type: Secondary | ICD-10-CM | POA: Diagnosis not present

## 2021-11-11 DIAGNOSIS — G4700 Insomnia, unspecified: Secondary | ICD-10-CM

## 2021-11-11 DIAGNOSIS — F411 Generalized anxiety disorder: Secondary | ICD-10-CM

## 2021-11-11 MED ORDER — UZEDY 100 MG/0.28ML ~~LOC~~ SUSY
100.0000 mg | PREFILLED_SYRINGE | SUBCUTANEOUS | 3 refills | Status: DC
  Filled 2021-11-11: qty 0.28, 30d supply, fill #0

## 2021-11-11 MED ORDER — TRAZODONE HCL 150 MG PO TABS
300.0000 mg | ORAL_TABLET | Freq: Every evening | ORAL | 3 refills | Status: DC | PRN
  Filled 2021-11-11 – 2021-11-23 (×2): qty 60, 30d supply, fill #0

## 2021-11-11 MED ORDER — PRAZOSIN HCL 2 MG PO CAPS
2.0000 mg | ORAL_CAPSULE | Freq: Every day | ORAL | 3 refills | Status: DC
  Filled 2021-11-11 – 2021-11-23 (×2): qty 30, 30d supply, fill #0

## 2021-11-11 MED ORDER — ZOLPIDEM TARTRATE 10 MG PO TABS
10.0000 mg | ORAL_TABLET | Freq: Every evening | ORAL | 3 refills | Status: DC | PRN
  Filled 2021-11-11 – 2021-11-23 (×2): qty 30, 30d supply, fill #0

## 2021-11-11 MED ORDER — LAMOTRIGINE 100 MG PO TABS
100.0000 mg | ORAL_TABLET | Freq: Every day | ORAL | 3 refills | Status: DC
  Filled 2021-11-11 – 2021-11-23 (×2): qty 30, 30d supply, fill #0

## 2021-11-11 MED ORDER — BUSPIRONE HCL 10 MG PO TABS
10.0000 mg | ORAL_TABLET | Freq: Three times a day (TID) | ORAL | 3 refills | Status: DC
  Filled 2021-11-11 – 2021-11-23 (×2): qty 90, 30d supply, fill #0

## 2021-11-11 MED ORDER — ESCITALOPRAM OXALATE 20 MG PO TABS
20.0000 mg | ORAL_TABLET | Freq: Every day | ORAL | 3 refills | Status: DC
  Filled 2021-11-11 – 2021-11-23 (×2): qty 30, 30d supply, fill #0

## 2021-11-11 MED ORDER — RISPERIDONE 4 MG PO TABS
4.0000 mg | ORAL_TABLET | Freq: Every day | ORAL | 1 refills | Status: DC
  Filled 2021-11-11 – 2021-11-23 (×2): qty 15, 15d supply, fill #0

## 2021-11-11 NOTE — Progress Notes (Signed)
BH MD/PA/NP OP Progress Note Virtual Visit via Video Note  I connected with Martha Lopez on 11/11/21 at  1:00 PM EDT by a video enabled telemedicine application and verified that I am speaking with the correct person using two identifiers.  Location: Patient: Home Provider: Clinic   I discussed the limitations of evaluation and management by telemedicine and the availability of in person appointments. The patient expressed understanding and agreed to proceed.  I provided 30 minutes of non-face-to-face time during this encounter.   11/11/2021 1:29 PM Martha Lopez  MRN:  [id number]  Chief Complaint: "Things are worse"   HPI: 31 year old female seen today for follow-up psychiatric evaluation.  She has a psychiatric history of depression, anxiety, and PTSD.  She is currently managed on Lexapro 20 mg daily,  Risperdal 4 mg nightly, trazodone 300 nightly as needed, prazosin 2 mg nightly, Buspar 10 mg three times daily, Lamictal 100 mg daily, and hydroxyzine 50 mg 3 times daily.  She notes her medications are somewhat effective in managing her psychiatric conditions.  Today she was well groomed, pleasant, cooperative, but disorganized.  At times in conversation patient was tearful.  She reports that things has gotten worse.  She notes that she is on a leave from work but now feels paranoid in her home. She reports that that she feels on edge like someone is going to harm her. She reports that she hasn't eaten in 2 days because she believes someone put something in her food. She reports that she feels unsafe with her mother. She reports that she wants to run away to be with her godmother.  Patient also informed Clinical research associate that she has been hallucinating.  She notes that she sees shadows and hears growling sounds.  She also reports that at times she is irritable, distractible, and has racing thoughts.  She denies impulsiveness but notes that at times she has behavioral outburst.  Patient unable to do  serial seven test.  She notes that she has difficulty thinking and presented with thought blocking throughout the exam.  Patient also unable to do a 5 word serial verbal memory test.  Provider asked patient her birthday and she originally said [birth year] however her birthday is [birth year].  When asked what month it is patient said Halloween.  A GAD-7 and PHQ-9 not conducted today.  Patient reports her sleep has somewhat improved since being on Ambien.  Patient recently had labs drawn on 10/31/2021.  Patient did have abnormal urinalysis and presented with a UTI.  Patient has completed her course of antibiotics.  All other labs were within normal limits.  Patient informed Clinical research associate that she is often forgetful in taking her medications.  Provider recommended a long-acting injectable for compliance.  She endorsed understanding and agreed.  Today she is agreeable to starting Uzedy 100 mg monthly to help manage symptoms of psychosis and mood. Potential side effects of medication and risks vs benefits of treatment vs non-treatment were explained and discussed. All questions were answered. She will continue her other medications as prescribed.  No other concerns noted at this time.   Visit Diagnosis:    ICD-10-CM   1. Schizoaffective disorder, bipolar type (HCC)  F25.0     2. Generalized anxiety disorder  F41.1 traZODone (DESYREL) 150 MG tablet    escitalopram (LEXAPRO) 20 MG tablet    busPIRone (BUSPAR) 10 MG tablet    3. PTSD (post-traumatic stress disorder)  F43.10 prazosin (MINIPRESS) 2 MG capsule  Past Psychiatric History: Depression and anxiety  Past Medical History:  Past Medical History:  Diagnosis Date   Anxiety    Anxiety    Depression    Depression    Diabetes mellitus without complication (HCC)    Type II   Epistaxis    Migraines    No past surgical history on file.  Family Psychiatric History: Mother depression and maternal grandmother schizophrenia, anxiety, and  depression  Family History:  Family History  Problem Relation Age of Onset   Hypertension Mother    Gout Father     Social History:  Social History   Socioeconomic History   Marital status: Single    Spouse name: Not on file   Number of children: Not on file   Years of education: Not on file   Highest education level: Not on file  Occupational History   Not on file  Tobacco Use   Smoking status: Never   Smokeless tobacco: Never  Vaping Use   Vaping Use: Former  Substance and Sexual Activity   Alcohol use: No   Drug use: No   Sexual activity: Never  Other Topics Concern   Not on file  Social History Narrative   Not on file   Social Determinants of Health   Financial Resource Strain: Not on file  Food Insecurity: Not on file  Transportation Needs: Not on file  Physical Activity: Not on file  Stress: Not on file  Social Connections: Not on file    Allergies: No Known Allergies  Metabolic Disorder Labs: No results found for: "HGBA1C", "MPG" No results found for: "PROLACTIN" No results found for: "CHOL", "TRIG", "HDL", "CHOLHDL", "VLDL", "LDLCALC" No results found for: "TSH"  Therapeutic Level Labs: No results found for: "LITHIUM" No results found for: "VALPROATE" No results found for: "CBMZ"  Current Medications: Current Outpatient Medications  Medication Sig Dispense Refill   risperiDONE ER (UZEDY) 100 MG/0.28ML SUSY Inject 100 mg into the skin every 30 (thirty) days. 100 mL 3   acetaminophen (TYLENOL) 500 MG tablet Take 1,000 mg by mouth every 6 (six) hours as needed for mild pain.     busPIRone (BUSPAR) 10 MG tablet Take 1 tablet (10 mg total) by mouth 3 (three) times daily. 90 tablet 3   escitalopram (LEXAPRO) 20 MG tablet Take 1 tablet (20 mg total) by mouth daily. 30 tablet 3   hydrocortisone (ANUSOL-HC) 2.5 % rectal cream Place 1 application rectally 2 (two) times daily. 30 g 0   hydrOXYzine (ATARAX) 50 MG tablet Take 1 tablet (50 mg total) by mouth  3 (three) times daily as needed. 90 tablet 3   lamoTRIgine (LAMICTAL) 100 MG tablet Take 1 tablet (100 mg total) by mouth daily. Take one tablet (100mg ) daily 30 tablet 3   metFORMIN (GLUCOPHAGE-XR) 750 MG 24 hr tablet Take 750 mg by mouth 2 (two) times daily.     Multiple Vitamins-Minerals (MULTIVITAMIN ADULT) CHEW Chew 1 tablet by mouth daily.     OZEMPIC, 0.25 OR 0.5 MG/DOSE, 2 MG/1.5ML SOPN Inject 1 mg into the skin as directed. Take on Wednesday     prazosin (MINIPRESS) 2 MG capsule Take 1 capsule (2 mg total) by mouth at bedtime. 30 capsule 3   risperidone (RISPERDAL) 4 MG tablet Take 1 tablet (4 mg total) by mouth at bedtime. 30 tablet 3   tiZANidine (ZANAFLEX) 2 MG tablet Take 2 mg by mouth every 8 (eight) hours as needed for muscle spasms.     traZODone (  DESYREL) 150 MG tablet Take 2 tablets (300 mg total) by mouth at bedtime as needed for sleep. 60 tablet 3   zolpidem (AMBIEN) 10 MG tablet Take 1 tablet (10 mg total) by mouth at bedtime as needed for sleep. 30 tablet 3   No current facility-administered medications for this visit.     Musculoskeletal: Strength & Muscle Tone: within normal limits and telehealth visit Gait & Station: normal, telehealth visit Patient leans: N/A  Psychiatric Specialty Exam: Review of Systems  Last menstrual period 10/21/2021.There is no height or weight on file to calculate BMI.  General Appearance: Well groomed  Eye Contact:  Good  Speech:  Clear and Coherent and Normal Rate  Volume:  Normal  Mood:  Anxious, Depressed, and Irritable  Affect:  appropriate and congruent   Thought Process:  Disorganized  Orientation:  Full (Time, Place, and Person)  Thought Content: Logical, Hallucinations: Auditory, and Paranoid Ideation   Suicidal Thoughts:  No  Homicidal Thoughts:  No  Memory:  Immediate;   Fair Recent;   Fair Remote;   Fair  Judgement:  Fair  Insight:  Fair  Psychomotor Activity:  Normal  Concentration:  Concentration: Fair and  Attention Span: Fair  Recall:  Good  Fund of Knowledge: Good  Language: Good  Akathisia:  No  Handed:  Right  AIMS (if indicated): not done  Assets:  Communication Skills Desire for Improvement Financial Resources/Insurance Housing Intimacy Physical Health Social Support  ADL's:  Intact  Cognition: WNL  Sleep:  Fair   Screenings: GAD-7    Flowsheet Row Video Visit from 08/20/2021 in [facility] Video Visit from 07/09/2021 in [facility] Video Visit from 02/19/2021 in [facility] Counselor from 03/10/2020 in [facility]  Total GAD-7 Score 10 18 19 4       PHQ2-9    Flowsheet Row Video Visit from 08/20/2021 in [facility] Video Visit from 07/09/2021 in [facility] Video Visit from 02/19/2021 in [facility] Counselor from 03/10/2020 in [facility] Nutrition from [facility]/25/2020 [facility]  PHQ-2 Total Score 2 6 5 1  0  PHQ-9 Total Score 4 21 18 4  --      Flowsheet Row ED from 10/31/2021 in [place] [facility] Video Visit from 07/09/2021 in [facility] ED from 01/18/2021 in [place] No Risk Error: Q7 should not be populated when Q6 is No No Risk        Assessment and Plan: Patient reports that increased anxiety, depression, psychosis, paranoia, and forgetfulness.  Patient informed Probation officer that she is often forgetful in taking her medications.  Provider recommended a long-acting injectable for compliance.  She endorsed understanding and agreed.  Today she is agreeable to starting Uzedy 100 mg monthly to help manage symptoms of psychosis and mood. She will continue her other medications as prescribed.    1. Generalized anxiety  disorder  Continue- traZODone (DESYREL) 150 MG tablet; Take 2 tablets (300 mg total) by mouth at bedtime as needed for sleep.  Dispense: 60 tablet; Refill: 3 Continue- escitalopram (LEXAPRO) 20 MG tablet; Take 1 tablet (20 mg total) by mouth daily.  Dispense: 30 tablet; Refill: 3 Continue- busPIRone (BUSPAR) 10 MG tablet; Take 1 tablet (10 mg total) by mouth 3 (three) times daily.  Dispense: 90 tablet;  Refill: 3  2. PTSD (post-traumatic stress disorder)  Continue- prazosin (MINIPRESS) 2 MG capsule; Take 1 capsule (2 mg total) by mouth at bedtime.  Dispense: 30 capsule; Refill: 3  3. Schizoaffective disorder, bipolar type (HCC)  Continue- lamoTRIgine (LAMICTAL) 100 MG tablet; Take 1 tablet (100 mg total) by mouth daily. Take one tablet (100mg ) daily  Dispense: 30 tablet; Refill: 3 Continue- risperiDONE ER (UZEDY) 100 MG/0.28ML SUSY; Inject 100 mg into the skin every 30 (thirty) days.  Dispense: 0.28 mL; Refill: 3 Continue- risperidone (RISPERDAL) 4 MG tablet; Take 1 tablet (4 mg total) by mouth at bedtime.  Dispense: 15 tablet; Refill: 1  4. 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  Dispense: 30 tablet; Refill: 3 Continue- traZODone (DESYREL) 150 MG tablet; Take 2 tablets (300 mg total) by mouth at bedtime as needed for sleep.  Dispense: 60 tablet; Refill: 3     Follow-up in 2 months Follow up with nursing staff in one week , NP 11/11/2021, 1:29 PM

## 2021-11-12 ENCOUNTER — Other Ambulatory Visit: Payer: Self-pay

## 2021-11-17 ENCOUNTER — Other Ambulatory Visit: Payer: Self-pay

## 2021-11-19 ENCOUNTER — Other Ambulatory Visit: Payer: Self-pay

## 2021-11-23 ENCOUNTER — Encounter (HOSPITAL_COMMUNITY): Payer: Self-pay

## 2021-11-23 ENCOUNTER — Ambulatory Visit (INDEPENDENT_AMBULATORY_CARE_PROVIDER_SITE_OTHER): Payer: No Payment, Other

## 2021-11-23 ENCOUNTER — Encounter (HOSPITAL_COMMUNITY): Payer: Self-pay | Admitting: Psychiatry

## 2021-11-23 ENCOUNTER — Other Ambulatory Visit: Payer: Self-pay

## 2021-11-23 ENCOUNTER — Ambulatory Visit (INDEPENDENT_AMBULATORY_CARE_PROVIDER_SITE_OTHER): Payer: No Payment, Other | Admitting: Psychiatry

## 2021-11-23 VITALS — BP 146/96 | HR 99 | Resp 13 | Ht 63.0 in | Wt 163.8 lb

## 2021-11-23 DIAGNOSIS — F4310 Post-traumatic stress disorder, unspecified: Secondary | ICD-10-CM

## 2021-11-23 DIAGNOSIS — F3181 Bipolar II disorder: Secondary | ICD-10-CM

## 2021-11-23 DIAGNOSIS — G4700 Insomnia, unspecified: Secondary | ICD-10-CM | POA: Diagnosis not present

## 2021-11-23 DIAGNOSIS — F411 Generalized anxiety disorder: Secondary | ICD-10-CM

## 2021-11-23 DIAGNOSIS — F250 Schizoaffective disorder, bipolar type: Secondary | ICD-10-CM

## 2021-11-23 DIAGNOSIS — F331 Major depressive disorder, recurrent, moderate: Secondary | ICD-10-CM

## 2021-11-23 MED ORDER — HYDROXYZINE HCL 50 MG PO TABS
50.0000 mg | ORAL_TABLET | Freq: Three times a day (TID) | ORAL | 3 refills | Status: DC | PRN
  Filled 2021-11-23: qty 90, 30d supply, fill #0

## 2021-11-23 MED ORDER — BUSPIRONE HCL 10 MG PO TABS
10.0000 mg | ORAL_TABLET | Freq: Three times a day (TID) | ORAL | 3 refills | Status: DC
  Filled 2021-12-01: qty 90, 30d supply, fill #0

## 2021-11-23 MED ORDER — ESCITALOPRAM OXALATE 20 MG PO TABS
20.0000 mg | ORAL_TABLET | Freq: Every day | ORAL | 3 refills | Status: DC
  Filled 2021-12-01: qty 30, 30d supply, fill #0

## 2021-11-23 MED ORDER — UZEDY 100 MG/0.28ML ~~LOC~~ SUSY: 100.0000 mg | PREFILLED_SYRINGE | SUBCUTANEOUS | 3 refills | Status: DC

## 2021-11-23 MED ORDER — ZOLPIDEM TARTRATE 10 MG PO TABS: 10.0000 mg | ORAL_TABLET | Freq: Every evening | ORAL | 3 refills | Status: DC | PRN

## 2021-11-23 MED ORDER — PRAZOSIN HCL 2 MG PO CAPS
2.0000 mg | ORAL_CAPSULE | Freq: Every day | ORAL | 3 refills | Status: DC
  Filled 2021-12-01: qty 30, 30d supply, fill #0

## 2021-11-23 MED ORDER — LAMOTRIGINE 100 MG PO TABS
100.0000 mg | ORAL_TABLET | Freq: Every day | ORAL | 3 refills | Status: DC
  Filled 2021-12-01: qty 30, 30d supply, fill #0

## 2021-11-23 MED ORDER — UZEDY 100 MG/0.28ML ~~LOC~~ SUSY
100.0000 mg | PREFILLED_SYRINGE | SUBCUTANEOUS | 3 refills | Status: DC
  Filled 2021-11-23 – 2021-12-01 (×2): qty 0.28, 30d supply, fill #0

## 2021-11-23 MED ORDER — TRAZODONE HCL 150 MG PO TABS: 300.0000 mg | ORAL_TABLET | Freq: Every evening | ORAL | 3 refills | Status: DC | PRN
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
## 2021-11-27 DIAGNOSIS — R112 Nausea with vomiting, unspecified: Secondary | ICD-10-CM | POA: Insufficient documentation

## 2021-11-27 DIAGNOSIS — R531 Weakness: Secondary | ICD-10-CM | POA: Diagnosis present

## 2021-11-27 DIAGNOSIS — Z5321 Procedure and treatment not carried out due to patient leaving prior to being seen by health care provider: Secondary | ICD-10-CM | POA: Insufficient documentation

## 2021-11-28 ENCOUNTER — Other Ambulatory Visit: Payer: Self-pay

## 2021-11-28 ENCOUNTER — Emergency Department (HOSPITAL_COMMUNITY)
Admission: EM | Admit: 2021-11-28 | Discharge: 2021-11-28 | Discharge status: Against Medical Advice | Payer: Commercial Managed Care - HMO | Attending: Student | Admitting: Student

## 2021-11-28 ENCOUNTER — Encounter (HOSPITAL_COMMUNITY): Payer: Self-pay

## 2021-11-28 NOTE — ED Triage Notes (Signed)
Complaining of general weakness after getting respiradone on Tuesday. Tired and weak not feeling like her self, nausea and vomiting for the last 2 days

## 2021-11-30 ENCOUNTER — Other Ambulatory Visit: Payer: Self-pay

## 2021-12-01 ENCOUNTER — Other Ambulatory Visit: Payer: Self-pay

## 2021-12-02 ENCOUNTER — Other Ambulatory Visit: Payer: Self-pay

## 2021-12-06 ENCOUNTER — Other Ambulatory Visit: Payer: Self-pay

## 2021-12-08 ENCOUNTER — Other Ambulatory Visit: Payer: Self-pay

## 2021-12-21 ENCOUNTER — Ambulatory Visit (HOSPITAL_COMMUNITY): Payer: No Payment, Other

## 2022-01-07 ENCOUNTER — Telehealth (HOSPITAL_COMMUNITY): Payer: Self-pay | Admitting: *Deleted

## 2022-01-07 NOTE — Telephone Encounter (Signed)
he number [phone] & a Female Martha Lopez is attached to the number . Phone Call was left by a female with no name  or date of birth left. Stating that Dr Doyne Keel is her doctor & that she needs refills on her med's BUT don't know which one's she needs. & also that she has a bump left by the injection she received in her stomach  & it keeps getting bigger & asked what should she do??  --- attempted call back & ##  continued to ring with no VM access.

## 2022-01-25 ENCOUNTER — Encounter (HOSPITAL_COMMUNITY): Payer: Self-pay | Admitting: Psychiatry

## 2022-01-25 ENCOUNTER — Encounter (HOSPITAL_COMMUNITY): Payer: No Payment, Other | Admitting: Psychiatry

## 2022-01-25 ENCOUNTER — Ambulatory Visit (INDEPENDENT_AMBULATORY_CARE_PROVIDER_SITE_OTHER): Payer: No Payment, Other | Admitting: Psychiatry

## 2022-01-25 ENCOUNTER — Other Ambulatory Visit: Payer: Self-pay

## 2022-01-25 DIAGNOSIS — F411 Generalized anxiety disorder: Secondary | ICD-10-CM

## 2022-01-25 DIAGNOSIS — G4700 Insomnia, unspecified: Secondary | ICD-10-CM | POA: Diagnosis not present

## 2022-01-25 DIAGNOSIS — F250 Schizoaffective disorder, bipolar type: Secondary | ICD-10-CM | POA: Diagnosis not present

## 2022-01-25 DIAGNOSIS — F4310 Post-traumatic stress disorder, unspecified: Secondary | ICD-10-CM

## 2022-01-25 MED ORDER — BUSPIRONE HCL 10 MG PO TABS
10.0000 mg | ORAL_TABLET | Freq: Three times a day (TID) | ORAL | 3 refills | Status: AC
  Filled 2022-01-25 – 2022-03-24 (×2): qty 90, 30d supply, fill #0

## 2022-01-25 MED ORDER — HYDROXYZINE HCL 50 MG PO TABS
50.0000 mg | ORAL_TABLET | Freq: Three times a day (TID) | ORAL | 3 refills | Status: DC | PRN
  Filled 2022-01-25 (×2): qty 90, 30d supply, fill #0

## 2022-01-25 MED ORDER — TRAZODONE HCL 150 MG PO TABS
150.0000 mg | ORAL_TABLET | Freq: Every evening | ORAL | 3 refills | Status: DC | PRN
  Filled 2022-01-25 – 2022-05-27 (×2): qty 30, 30d supply, fill #0

## 2022-01-25 MED ORDER — LAMOTRIGINE 100 MG PO TABS
100.0000 mg | ORAL_TABLET | Freq: Every day | ORAL | 3 refills | Status: DC
  Filled 2022-01-25 – 2022-05-27 (×3): qty 30, 30d supply, fill #0

## 2022-01-25 MED ORDER — PRAZOSIN HCL 2 MG PO CAPS
2.0000 mg | ORAL_CAPSULE | Freq: Every day | ORAL | 3 refills | Status: DC
  Filled 2022-01-25 – 2022-05-27 (×4): qty 30, 30d supply, fill #0

## 2022-01-25 MED ORDER — UZEDY 100 MG/0.28ML ~~LOC~~ SUSY
100.0000 mg | PREFILLED_SYRINGE | SUBCUTANEOUS | 3 refills | Status: DC
  Filled 2022-01-25: qty 0.28, fill #0
  Filled 2022-03-24: qty 0.28, 30d supply, fill #0

## 2022-01-25 NOTE — Progress Notes (Signed)
[facility] MD/PA/NP OP Progress Note Virtual Visit via Telephone Note  I connected with Martha Lopez on 01/25/22 at  2:30 PM EST by telephone and verified that I am speaking with the correct person using two identifiers.  Location: Patient: home Provider: Clinic   I discussed the limitations, risks, security and privacy concerns of performing an evaluation and management service by telephone and the availability of in person appointments. I also discussed with the patient that there may be a patient responsible charge related to this service. The patient expressed understanding and agreed to proceed.   I provided 30 minutes of non-face-to-face time during this encounter.    01/25/2022 3:15 PM Martha Lopez  MRN:  [id number]  Chief Complaint: "I feel less paranoid"   HPI: 32 year old female seen today for follow-up psychiatric evaluation.  She has a psychiatric history of depression, schizoaffective disorder bipolar type anxiety, and PTSD.  She is currently managed on Lexapro 20 mg daily, Uzedy 100 mg monthly, trazodone 300 nightly as needed, prazosin 2 mg nightly, Buspar 10 mg three times daily, Lamictal 100 mg daily, and hydroxyzine 50 mg 3 times daily.  She notes her medications are somewhat effective in managing her psychiatric conditions however reports that she is in need of refills.  Today she was unable to login virtually so her assessment was done over the phone.  During exam patient informed writer that she feels less paranoid.  She notes that she has not had a hallucination in a few weeks.  Patient informed Probation officer that she likes Uzedy as it that helps manage paranoia and hallucinations.  Patient missed her last injection but is agreeable to receiving her next dose of Uzedy on 01/27/2022.  Patient informed Probation officer that she continues to have issues with sleep however reports that she ran out of trazodone and Ambien.  Patient has been without Ambien for over a month and provider recommended  that trazodone be trialed first prior to restarting Ambien.  She was agreeable to this.  Patient notes that she is only been sleeping 2 to 3 hours nightly.  She notes that she continues to be anxious and depressed but reports that this too has improved.  Provider conducted a GAD-7 and patient scored an 18.  Provider also conducted PHQ-9 patient scored a 20.  She endorses having a poor appetite and notes that she has lost over 10 pounds since her last visit.  Today she endorses passive SI but denies wanting to harm himself today.  She denies SI/HI/VAH, mania, paranoia.    Patient informed Probation officer that she is now living with her godmother.  She notes that she still has her apartment but reports that her electricity is not on.  Patient was seen by [facility] caseworker at her last visit.  Provider informed her to discussed utility assistance with [name] at her next visit.  She endorsed understanding and agreed.  Today patient is agreeable to restarting trazodone 150 mg (previously on trazodone 300 mg) to help manage sleep, anxiety, and depression.  Patient notes that she has been taking an old prescription of Lamictal and will restart/continue Lamictal 100 mg daily to help manage mood.  She will also restart hydroxyzine 50 mg 3 times daily to help manage anxiety and sleep.  Patient notes that she has been having nightmares and has been taking an old prescription of prazosin which she will also restart today.  Patient will restart BuSpar 10 mg 3 times daily.  At this time Lexapro not  restarted.  This medication can be restarted in the future if needed.  At this time Ambien not reordered.  No other concerns at this time.   Visit Diagnosis:    ICD-10-CM   1. Generalized anxiety disorder  F41.1 busPIRone (BUSPAR) 10 MG tablet    traZODone (DESYREL) 150 MG tablet    2. PTSD (post-traumatic stress disorder)  F43.10 hydrOXYzine (ATARAX) 50 MG tablet    prazosin (MINIPRESS) 2 MG capsule    3. Schizoaffective  disorder, bipolar type (HCC)  F25.0 lamoTRIgine (LAMICTAL) 100 MG tablet    risperiDONE ER (UZEDY) 100 MG/0.28ML SUSY    4. 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  Family Psychiatric History: Mother depression and maternal grandmother schizophrenia, anxiety, and depression  Family History:  Family History  Problem Relation Age of Onset   Hypertension Mother    Gout Father     Social History:  Social History   Socioeconomic History   Marital status: Single    Spouse name: Not on file   Number of children: Not on file   Years of education: Not on file   Highest education level: Not on file  Occupational History   Not on file  Tobacco Use   Smoking status: Never   Smokeless tobacco: Never  Vaping Use   Vaping Use: Former  Substance and Sexual Activity   Alcohol use: No   Drug use: No   Sexual activity: Never  Other Topics Concern   Not on file  Social History Narrative   Not on file   Social Determinants of Health   Financial Resource Strain: Not on file  Food Insecurity: Not on file  Transportation Needs: Not on file  Physical Activity: Not on file  Stress: Not on file  Social Connections: Not on file    Allergies: No Known Allergies  Metabolic Disorder Labs: No results found for: "HGBA1C", "MPG" No results found for: "PROLACTIN" No results found for: "CHOL", "TRIG", "HDL", "CHOLHDL", "VLDL", "LDLCALC" No results found for: "TSH"  Therapeutic Level Labs: No results found for: "LITHIUM" No results found for: "VALPROATE" No results found for:  "CBMZ"  Current Medications: Current Outpatient Medications  Medication Sig Dispense Refill   acetaminophen (TYLENOL) 500 MG tablet Take 1,000 mg by mouth every 6 (six) hours as needed for mild pain.     busPIRone (BUSPAR) 10 MG tablet Take 1 tablet (10 mg total) by mouth 3 (three) times daily. 90 tablet 3   hydrocortisone (ANUSOL-HC) 2.5 % rectal cream Place 1 application rectally 2 (two) times daily. 30 g 0   hydrOXYzine (ATARAX) 50 MG tablet Take 1 tablet (50 mg total) by mouth 3 (three) times daily as needed. 90 tablet 3   lamoTRIgine (LAMICTAL) 100 MG tablet Take 1 tablet (100 mg total) by mouth daily. 30 tablet 3   metFORMIN (GLUCOPHAGE-XR) 750 MG 24 hr tablet Take 750 mg by mouth 2 (two) times daily.     Multiple Vitamins-Minerals (MULTIVITAMIN ADULT) CHEW Chew 1 tablet by mouth daily.     OZEMPIC,  0.25 OR 0.5 MG/DOSE, 2 MG/1.5ML SOPN Inject 1 mg into the skin as directed. Take on Wednesday     prazosin (MINIPRESS) 2 MG capsule Take 1 capsule (2 mg total) by mouth at bedtime. 30 capsule 3   risperiDONE ER (UZEDY) 100 MG/0.28ML SUSY Inject 100 mg into the skin every 30 (thirty) days. 0.28 mL 3   tiZANidine (ZANAFLEX) 2 MG tablet Take 2 mg by mouth every 8 (eight) hours as needed for muscle spasms.     traZODone (DESYREL) 150 MG tablet Take 1 tablet (150 mg total) by mouth at bedtime as needed for sleep. 30 tablet 3   No current facility-administered medications for this visit.     Musculoskeletal: Strength & Muscle Tone:  Unable to assess due to telephone visit Gait & Station:  Unable to assess due to telephone visit Patient leans: N/A  Psychiatric Specialty Exam: Review of Systems  There were no vitals taken for this visit.There is no height or weight on file to calculate BMI.  General Appearance: Unable to assess due to telephone visit  Eye Contact:   Unable to assess due to telephone visit  Speech:  Clear and Coherent and Normal Rate  Volume:  Normal  Mood:  Anxious and  Depressed  Affect:  appropriate and congruent   Thought Process:  Disorganized  Orientation:  Full (Time, Place, and Person)  Thought Content: WDL and Logical   Suicidal Thoughts:  No  Homicidal Thoughts:  No  Memory:  Immediate;   Good Recent;   Good Remote;   Good  Judgement:  Good  Insight:  Good and Fair  Psychomotor Activity:   Unable to assess due to telephone visit  Concentration:  Concentration: Good and Attention Span: Good  Recall:  Good  Fund of Knowledge: Good  Language: Good  Akathisia:  Unable to assess due to telephone visit  Handed:  Right  AIMS (if indicated): not done  Assets:  Communication Skills Desire for Improvement Financial Resources/Insurance Housing Intimacy Physical Health Social Support  ADL's:  Intact  Cognition: WNL  Sleep:  Poor   Screenings: GAD-7    Flowsheet Row Clinical Support from 01/25/2022 in [facility] Video Visit from 08/20/2021 in [facility] Video Visit from 07/09/2021 in [facility] Video Visit from 02/19/2021 in [facility] Counselor from 03/10/2020 in [facility]  Total GAD-7 Score 18 10 18 19 4       PHQ2-9    [place] from 01/25/2022 in [facility] Video Visit from 08/20/2021 in [facility] Video Visit from 07/09/2021 in [facility] Video Visit from 02/19/2021 in [facility] Counselor from 03/10/2020 in [place]  PHQ-2 Total Score 6 2 6 5 1   PHQ-9 Total Score 20 4 21 18 4       [place] from 01/25/2022 in [facility] ED from 11/28/2021 in [place] DEPT ED from 10/31/2021 in [place] DEPT  C-SSRS RISK CATEGORY Error:  Q7 should not be populated when Q6 is No No Risk No Risk        Assessment and Plan: Patient informed writer that her paranoia, hallucinations, anxiety, and depression has somewhat improved since her last visit.  Patient missed her last visit and has been out of some medications.  She currently  is having issues sleeping.  Today patient is agreeable to restarting trazodone 150 mg (previously on trazodone 300 mg) to help manage sleep, anxiety, and depression.  Patient notes that she has been taking an old prescription of Lamictal and will restart/continue Lamictal 100 mg daily to help manage mood.  She will also restart hydroxyzine 50 mg 3 times daily to help manage anxiety and sleep.  Patient notes that she has been having nightmares and has been taking an old prescription of prazosin which she will also restart today.  Patient will restart BuSpar 10 mg 3 times daily.  At this time Lexapro not restarted.  This medication can be restarted in the future if needed.  At this time Ambien not reordered.  1. Generalized anxiety disorder  Restart- busPIRone (BUSPAR) 10 MG tablet; Take 1 tablet (10 mg total) by mouth 3 (three) times daily.  Dispense: 90 tablet; Refill: 3 Restart- traZODone (DESYREL) 150 MG tablet; Take 1 tablet (150 mg total) by mouth at bedtime as needed for sleep.  Dispense: 30 tablet; Refill: 3  2. PTSD (post-traumatic stress disorder)  Restart- hydrOXYzine (ATARAX) 50 MG tablet; Take 1 tablet (50 mg total) by mouth 3 (three) times daily as needed.  Dispense: 90 tablet; Refill: 3 Restart- prazosin (MINIPRESS) 2 MG capsule; Take 1 capsule (2 mg total) by mouth at bedtime.  Dispense: 30 capsule; Refill: 3  3. Schizoaffective disorder, bipolar type (HCC)  Restart- lamoTRIgine (LAMICTAL) 100 MG tablet; Take 1 tablet (100 mg total) by mouth daily.  Dispense: 30 tablet; Refill: 3 Restart- risperiDONE ER (UZEDY) 100 MG/0.28ML SUSY; Inject 100 mg into the skin every 30 (thirty) days.   Dispense: 0.28 mL; Refill: 3  4. 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  Dispense: 30 tablet; Refill: 3    Follow-up in 3 months Follow up with nursing staff in one week Salley Slaughter, NP 01/25/2022, 3:15 PM

## 2022-01-31 ENCOUNTER — Telehealth (HOSPITAL_COMMUNITY): Payer: Self-pay | Admitting: Psychiatry

## 2022-01-31 ENCOUNTER — Other Ambulatory Visit: Payer: Self-pay

## 2022-02-02 NOTE — Telephone Encounter (Signed)
Provider spoke to patient who notes that she would like to return back to work.  She however notes that she continues to feel paranoid, depressed, and anxious at times.  Provider asked patient if she feels that going back to work would be the safest thing at this time.  She notes that it would not.  She informed Probation officer that she finds very effective in helping managing the symptoms however notes that it is wearing off that she has missed her last injection.  Provider recommended patient taking oral Risperdal or having her Uzedy injection.  She endorsed understanding and notes that she prefers getting her injection.  She reports that she will attempt to come into the clinic on 02/02/2022 or 02/03/2022.

## 2022-02-22 ENCOUNTER — Encounter (HOSPITAL_COMMUNITY): Payer: Medicaid Other | Admitting: Psychiatry

## 2022-03-24 ENCOUNTER — Other Ambulatory Visit: Payer: Self-pay

## 2022-03-24 ENCOUNTER — Other Ambulatory Visit (HOSPITAL_COMMUNITY): Payer: Self-pay

## 2022-03-25 ENCOUNTER — Other Ambulatory Visit (HOSPITAL_COMMUNITY): Payer: Self-pay

## 2022-03-31 ENCOUNTER — Other Ambulatory Visit: Payer: Self-pay

## 2022-05-18 IMAGING — DX DG CHEST 1V PORT
1 series · 1 of 1 positions shown · non-contrast
Comparison: None.

CLINICAL DATA: Cough

EXAM:
PORTABLE CHEST 1 VIEW

[chest ap]
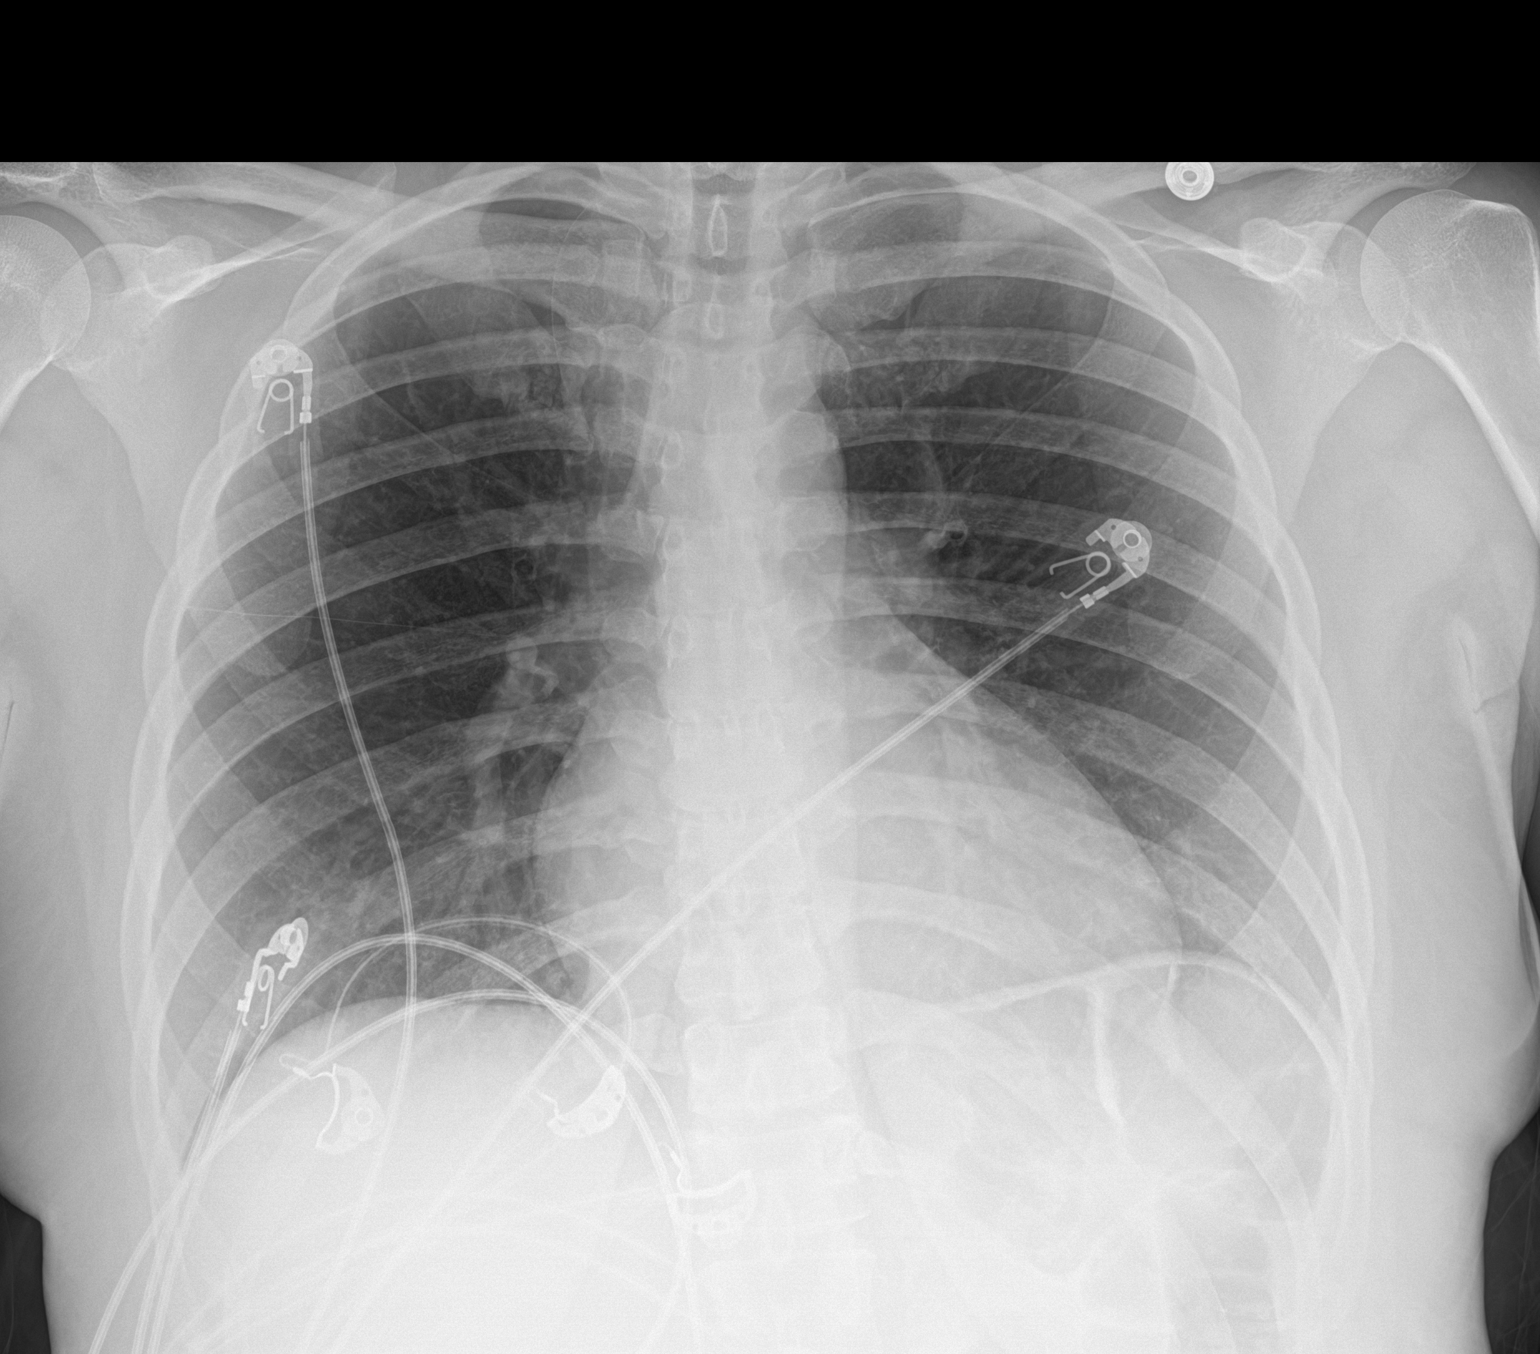

[1 of 1 positions shown; findings below may reference images not displayed]

FINDINGS: The heart size and mediastinal contours are within normal limits.
Both lungs are clear. The visualized skeletal structures are
unremarkable.
IMPRESSION: No active disease.

## 2022-05-18 IMAGING — CT CT ABD-PELV W/ CM
2 of 4 series · 16 of 46 positions shown, 18 images · IV contrast (omnipaque)
Comparison: None.

CLINICAL DATA: Abdominal pain, fever, perineal discharge, rectal
bleeding

EXAM:
CT ABDOMEN AND PELVIS WITH CONTRAST
TECHNIQUE: Multidetector CT imaging of the abdomen and pelvis was performed
using the standard protocol following bolus administration of
intravenous contrast.
CONTRAST:  100mL OMNIPAQUE IOHEXOL 300 MG/ML  SOLN

[Series 3: a/p w/ 5mm · axial · 0.71mm/px · z∈[-793,-353]mm · 13 of 98 slices shown, 15 images]
[im 5/98  soft-tissue]
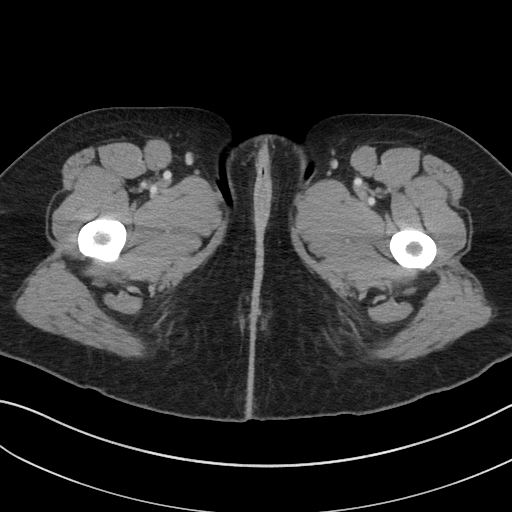
[im 5/98  bone]
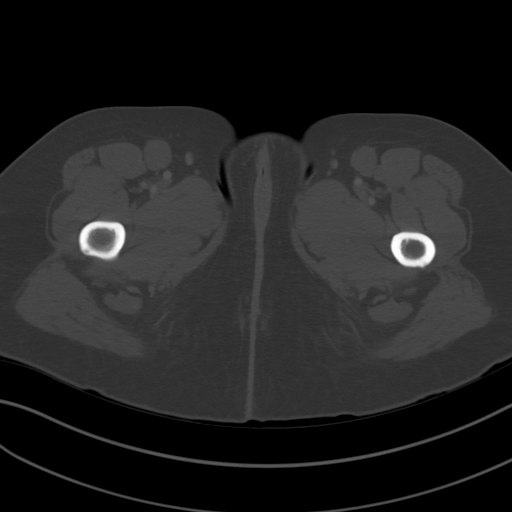
[im 13/98  soft-tissue]
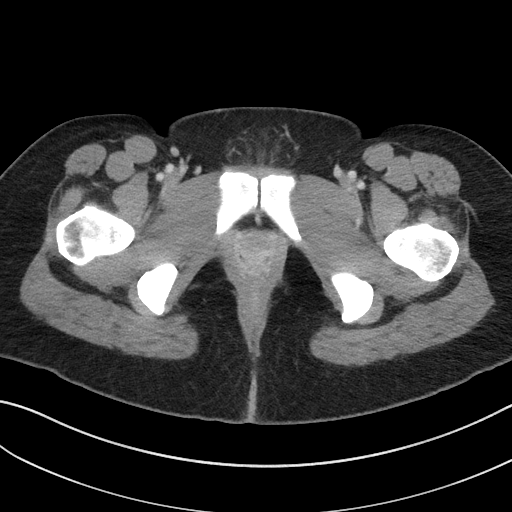
[im 21/98  soft-tissue]
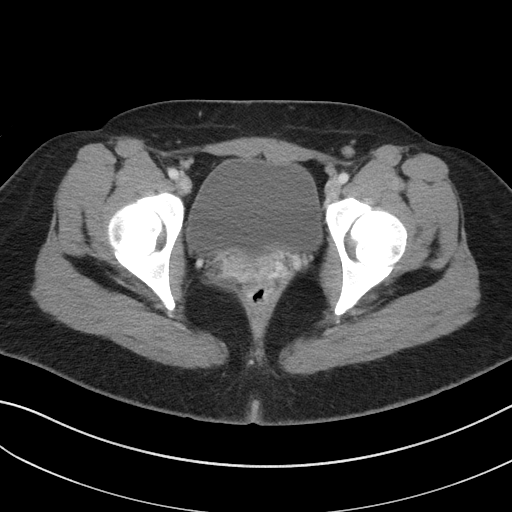
[im 29/98  soft-tissue]
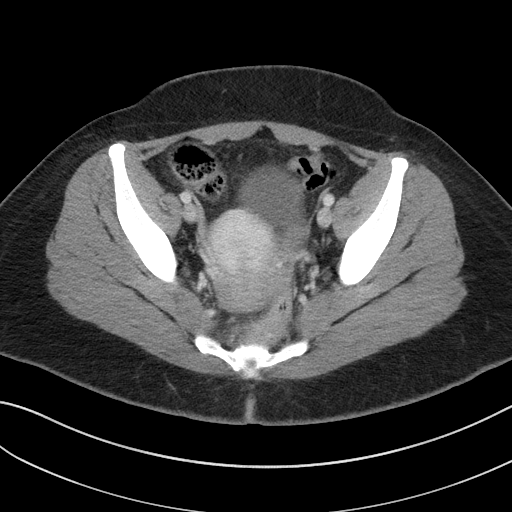
[im 33/98  soft-tissue]
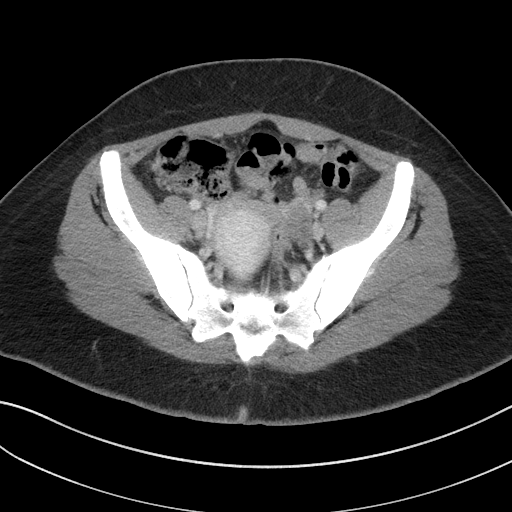
[im 41/98  soft-tissue]
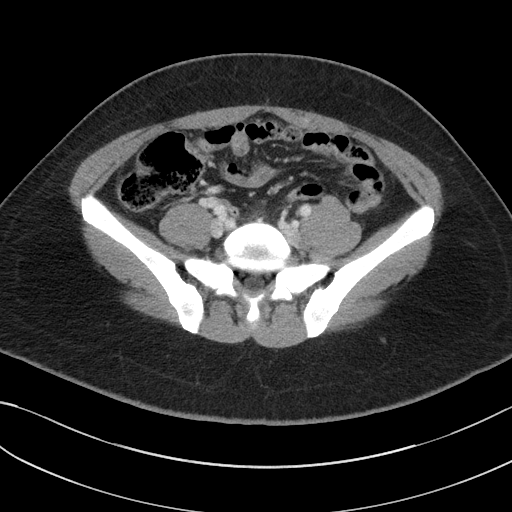
[im 49/98  soft-tissue]
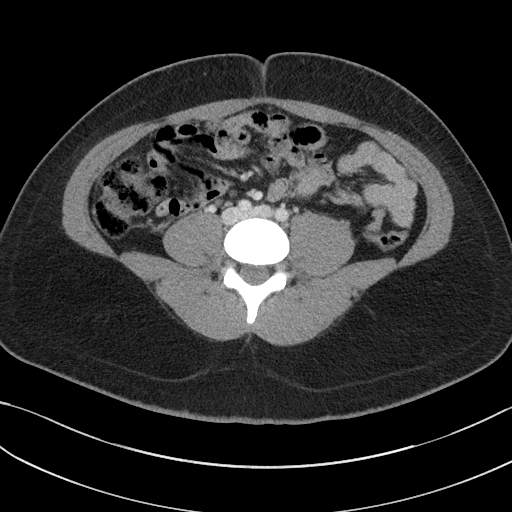
[im 57/98  soft-tissue]
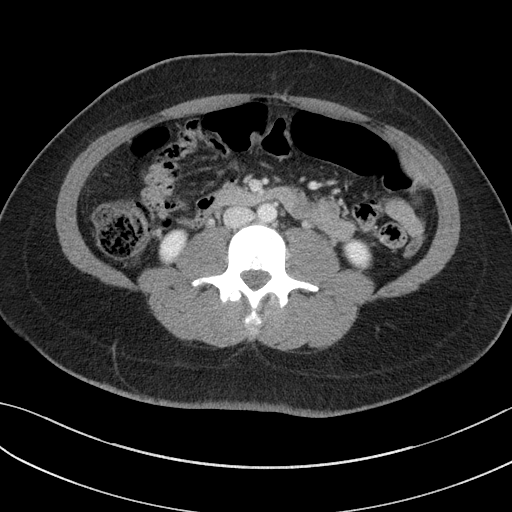
[im 65/98  soft-tissue]
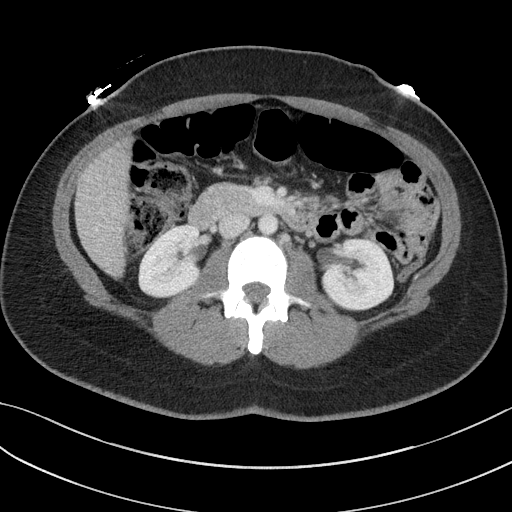
[im 65/98  bone]
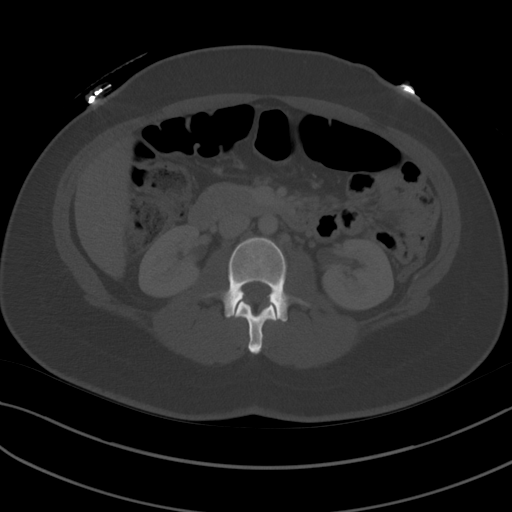
[im 69/98  soft-tissue]
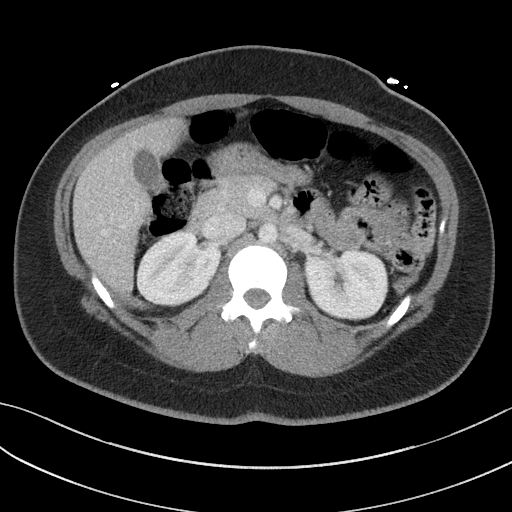
[im 77/98  soft-tissue]
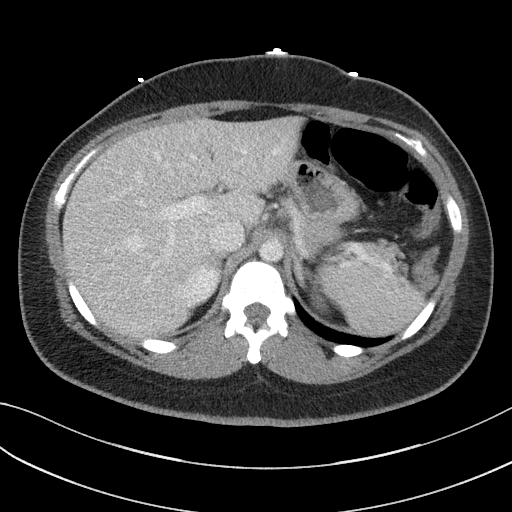
[im 85/98  soft-tissue]
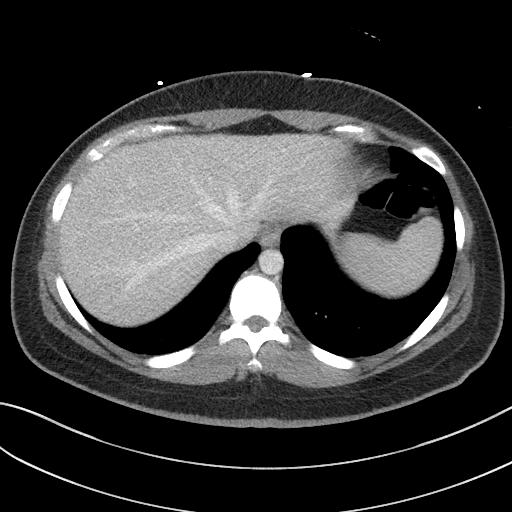
[im 93/98  soft-tissue]
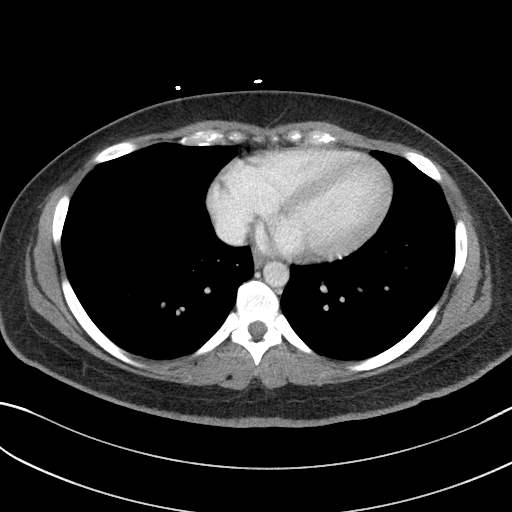

[Series 6: a/p w/ cor · coronal · 0.85mm/px · 3 of 149 slices shown]
[im 50/149  soft-tissue]
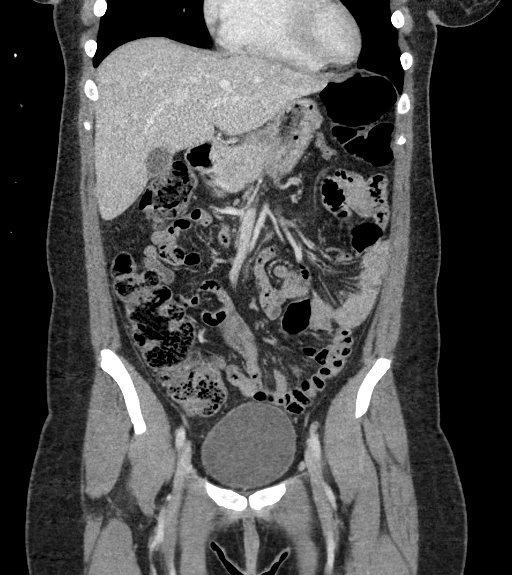
[im 66/149  soft-tissue]
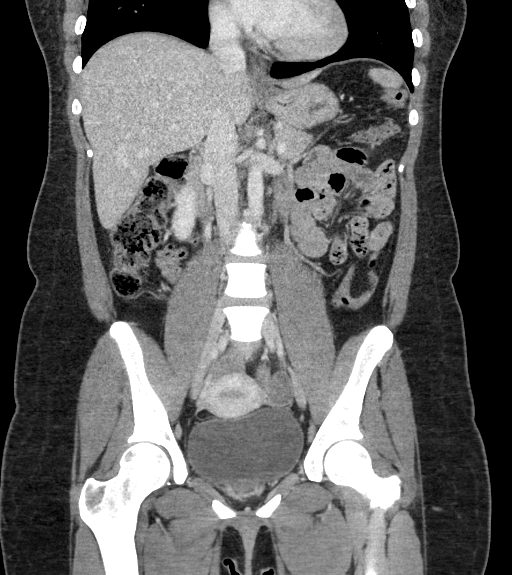
[im 83/149  soft-tissue]
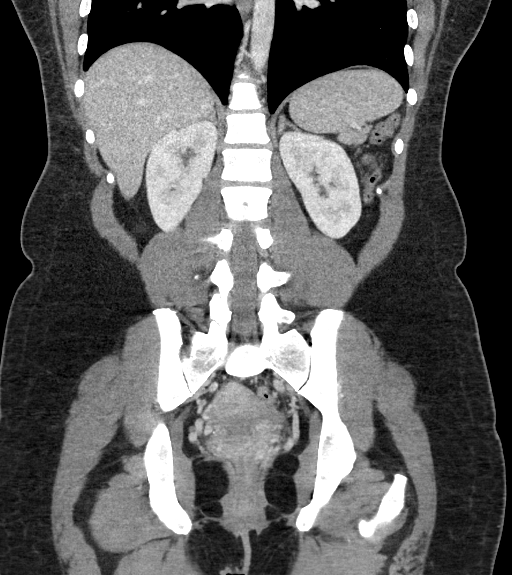

[16 of 46 positions shown; findings below may reference images not displayed]

FINDINGS: Lower chest: No acute pleural or parenchymal lung disease.

Hepatobiliary: No focal liver abnormality is seen. No gallstones,
gallbladder wall thickening, or biliary dilatation.

Pancreas: Unremarkable. No pancreatic ductal dilatation or
surrounding inflammatory changes.

Spleen: Normal in size without focal abnormality.

Adrenals/Urinary Tract: Adrenal glands are unremarkable. Kidneys are
normal, without renal calculi, focal lesion, or hydronephrosis.
Bladder is unremarkable.

Stomach/Bowel: No bowel obstruction or ileus. Normal appendix right
lower quadrant. No bowel wall thickening or inflammatory change.

Vascular/Lymphatic: Bilateral inguinal lymph nodes are identified,
largest on the left measuring 13 mm in short axis, likely reactive.
No pathologic adenopathy elsewhere within the abdomen or pelvis. No
significant vascular findings.

Reproductive: Uterus and bilateral adnexa are unremarkable.

Other: No free fluid or free gas. No abdominal wall hernia. Limited
evaluation through the perineum demonstrates no evidence of fluid
collection or abscess.

Musculoskeletal: No acute or destructive bony lesions. Reconstructed
images demonstrate no additional findings.
IMPRESSION: 1. Borderline enlarged inguinal lymph nodes likely reactive.
2. Otherwise no acute intra-abdominal or intrapelvic process.

## 2022-05-24 ENCOUNTER — Encounter (HOSPITAL_COMMUNITY): Payer: Medicaid Other | Admitting: Psychiatry

## 2022-05-27 ENCOUNTER — Other Ambulatory Visit: Payer: Self-pay

## 2022-06-03 ENCOUNTER — Other Ambulatory Visit: Payer: Self-pay

## 2022-08-12 ENCOUNTER — Telehealth (HOSPITAL_COMMUNITY): Payer: Self-pay | Admitting: Psychiatry

## 2022-08-16 NOTE — Telephone Encounter (Signed)
Provider called the patient over 6 times without success.  Voicemail unable to be left at this time.  Patient will need to schedule an appointment to be evaluated as she has not been seen in over 6 months.

## 2022-08-26 ENCOUNTER — Telehealth (HOSPITAL_COMMUNITY): Payer: Self-pay | Admitting: Psychiatry

## 2022-08-29 ENCOUNTER — Other Ambulatory Visit: Payer: Self-pay

## 2022-08-29 ENCOUNTER — Encounter (HOSPITAL_COMMUNITY): Payer: Self-pay | Admitting: Psychiatry

## 2022-08-29 ENCOUNTER — Telehealth (HOSPITAL_COMMUNITY): Payer: MEDICAID | Admitting: Psychiatry

## 2022-08-29 DIAGNOSIS — G4700 Insomnia, unspecified: Secondary | ICD-10-CM

## 2022-08-29 DIAGNOSIS — F411 Generalized anxiety disorder: Secondary | ICD-10-CM

## 2022-08-29 DIAGNOSIS — F5105 Insomnia due to other mental disorder: Secondary | ICD-10-CM | POA: Diagnosis not present

## 2022-08-29 DIAGNOSIS — F250 Schizoaffective disorder, bipolar type: Secondary | ICD-10-CM | POA: Diagnosis not present

## 2022-08-29 MED ORDER — ESCITALOPRAM OXALATE 20 MG PO TABS
20.0000 mg | ORAL_TABLET | Freq: Every day | ORAL | 3 refills | Status: AC
  Filled 2022-08-29 – 2023-05-19 (×4): qty 30, 30d supply, fill #0

## 2022-08-29 MED ORDER — RISPERIDONE 2 MG PO TABS
2.0000 mg | ORAL_TABLET | Freq: Every day | ORAL | 3 refills | Status: DC
  Filled 2022-08-29 – 2023-01-16 (×2): qty 30, 30d supply, fill #0

## 2022-08-29 MED ORDER — TRAZODONE HCL 150 MG PO TABS
150.0000 mg | ORAL_TABLET | Freq: Every evening | ORAL | 3 refills | Status: AC | PRN
  Filled 2022-08-29 – 2023-05-19 (×2): qty 30, 30d supply, fill #0

## 2022-08-29 NOTE — Telephone Encounter (Signed)
Provider called [name] however she did not pick up.  Provider left a vague message informing her that if her child was in distress she could bring her to [facility].  Provider did not leave any identifying information about the patient specifically.  Patient request that her mother not have access to her medical records.  Provider informed patient that her information was not shared with her mother.  She endorsed understanding and was grateful.  She does note that her godmother [name] can have access to her information.  No other concerns at this time.
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
## 2022-09-07 ENCOUNTER — Other Ambulatory Visit: Payer: Self-pay

## 2022-09-26 ENCOUNTER — Encounter (HOSPITAL_COMMUNITY): Payer: Self-pay

## 2022-09-26 ENCOUNTER — Telehealth (HOSPITAL_COMMUNITY): Payer: MEDICAID | Admitting: Psychiatry

## 2023-01-16 ENCOUNTER — Other Ambulatory Visit: Payer: Self-pay

## 2023-01-16 ENCOUNTER — Other Ambulatory Visit (HOSPITAL_COMMUNITY): Payer: Self-pay

## 2023-01-17 ENCOUNTER — Other Ambulatory Visit: Payer: Self-pay

## 2023-01-17 ENCOUNTER — Encounter: Payer: Self-pay | Admitting: Pharmacist

## 2023-01-20 ENCOUNTER — Other Ambulatory Visit: Payer: Self-pay

## 2023-03-06 ENCOUNTER — Other Ambulatory Visit: Payer: Self-pay

## 2023-03-06 ENCOUNTER — Emergency Department (HOSPITAL_COMMUNITY)
Admission: EM | Admit: 2023-03-06 | Discharge: 2023-03-06 | Disposition: A | Discharge status: Home / Self Care | Payer: MEDICAID | Attending: Emergency Medicine | Admitting: Emergency Medicine

## 2023-03-06 ENCOUNTER — Encounter (HOSPITAL_COMMUNITY): Payer: Self-pay

## 2023-03-06 DIAGNOSIS — Z79899 Other long term (current) drug therapy: Secondary | ICD-10-CM | POA: Diagnosis not present

## 2023-03-06 DIAGNOSIS — F22 Delusional disorders: Secondary | ICD-10-CM | POA: Diagnosis not present

## 2023-03-06 DIAGNOSIS — F4310 Post-traumatic stress disorder, unspecified: Secondary | ICD-10-CM | POA: Diagnosis not present

## 2023-03-06 DIAGNOSIS — E119 Type 2 diabetes mellitus without complications: Secondary | ICD-10-CM | POA: Insufficient documentation

## 2023-03-06 DIAGNOSIS — Z7984 Long term (current) use of oral hypoglycemic drugs: Secondary | ICD-10-CM | POA: Insufficient documentation

## 2023-03-06 DIAGNOSIS — F250 Schizoaffective disorder, bipolar type: Secondary | ICD-10-CM

## 2023-03-06 DIAGNOSIS — F411 Generalized anxiety disorder: Secondary | ICD-10-CM | POA: Diagnosis present

## 2023-03-06 DIAGNOSIS — F259 Schizoaffective disorder, unspecified: Secondary | ICD-10-CM | POA: Diagnosis not present

## 2023-03-06 LAB — COMPREHENSIVE METABOLIC PANEL
ALT: 16 U/L (ref 0–44)
AST: 27 U/L (ref 15–41)
Albumin: 4.2 g/dL (ref 3.5–5.0)
Alkaline Phosphatase: 44 U/L (ref 38–126)
Anion gap: 13 (ref 5–15)
BUN: 10 mg/dL (ref 6–20)
CO2: 22 mmol/L (ref 22–32)
Calcium: 9.4 mg/dL (ref 8.9–10.3)
Chloride: 104 mmol/L (ref 98–111)
Creatinine, Ser: 0.86 mg/dL (ref 0.44–1.00)
GFR, Estimated: >60 mL/min (ref >60)
Glucose, Bld: 96 mg/dL (ref 70–99)
Potassium: 3.8 mmol/L (ref 3.5–5.1)
Sodium: 139 mmol/L (ref 135–145)
Total Bilirubin: 1 mg/dL (ref 0.0–1.2)
Total Protein: 7.4 g/dL (ref 6.5–8.1)

## 2023-03-06 LAB — CBC
HCT: 39.1 % (ref 36.0–46.0)
Hemoglobin: 12.5 g/dL (ref 12.0–15.0)
MCH: 26.4 pg (ref 26.0–34.0)
MCHC: 32 g/dL (ref 30.0–36.0)
MCV: 82.7 fL (ref 80.0–100.0)
Platelets: 325 10*3/uL (ref 150–400)
RBC: 4.73 MIL/uL (ref 3.87–5.11)
RDW: 13.4 % (ref 11.5–15.5)
WBC: 12.9 10*3/uL — ABNORMAL HIGH (ref 4.0–10.5)
nRBC: 0 % (ref 0.0–0.2)

## 2023-03-06 LAB — ACETAMINOPHEN LEVEL: Acetaminophen (Tylenol), Serum: <10 ug/mL — ABNORMAL LOW (ref 10–30)

## 2023-03-06 LAB — ETHANOL: Alcohol, Ethyl (B): <10 mg/dL (ref <10)

## 2023-03-06 LAB — HCG, SERUM, QUALITATIVE: Preg, Serum: NEGATIVE

## 2023-03-06 LAB — SALICYLATE LEVEL: Salicylate Lvl: <7 mg/dL — ABNORMAL LOW (ref 7.0–30.0)

## 2023-03-06 MED ORDER — RISPERIDONE 1 MG PO TABS: 1.0000 mg | ORAL_TABLET | Freq: Two times a day (BID) | ORAL | 1 refills | Status: AC

## 2023-03-06 MED ORDER — RISPERIDONE 1 MG PO TABS
1.0000 mg | ORAL_TABLET | Freq: Two times a day (BID) | ORAL | 0 refills | Status: AC
Start: 2023-03-06 — End: ?

## 2023-03-06 NOTE — Discharge Instructions (Addendum)
Started oral risperdone twice daliy - 1 mg in the morning and 1 mg at night See your pschiatric doctor / provider this week. ER for worsening symptoms.   Outpatient psychiatric Services  Walk in hours for medication management Monday, Wednesday, Thursday, and Friday from 8:00 AM to 11:00 AM Recommend arriving by by 7:30 AM.  It is first come first serve.    Walk in hours for therapy intake Monday and Wednesday only 8:00 AM to 11:00 AM Encouraged to arrive by 7:30 AM.  It is first come first serve   Inpatient patient psychiatric services [facility] offers comprehensive behavioral heath care services for mental health and substance abuse treatment.  Social work can also assist with referral to or getting you into a rehabilitation program short or long term

## 2023-03-06 NOTE — ED Notes (Signed)
 Pt unable to void at this time.  KM
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
## 2023-03-06 NOTE — ED Provider Notes (Signed)
I attempted to call the phone number on the chart for the father who is the only contact, there was no answer and it went to voicemail.  The patient is not in physical or emotional distress, she is not intoxicated, her vital signs are normal, she is medically cleared for evaluation by the psychiatry service   Eber Hong, MD 03/06/23 [phone]
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
## 2023-04-21 ENCOUNTER — Ambulatory Visit (HOSPITAL_COMMUNITY): Payer: MEDICAID | Admitting: Student

## 2023-04-21 DIAGNOSIS — F250 Schizoaffective disorder, bipolar type: Secondary | ICD-10-CM

## 2023-04-21 NOTE — Progress Notes (Signed)
 BH MD Outpatient Progress Note  Date of visit: 04/21/2023 Martha Lopez  MRN:  [id number]  Assessment:  Martha Lopez presents as a walk-in for follow-up evaluation.  This is a patient of a psychiatric nurse practitioner at this clinic and has been given the diagnosis schizoaffective disorder bipolar type.  The patient has not presented for follow-up since August 2024.  At that visit it appears she was experiencing pressured speech as well as paranoia.  She was later seen in February 2025 in the emergency department for "paranoid thoughts and extreme anxiety that someone was trying to kill her".  On that assessment she was felt to be appropriate for discharge home.  It does not appear there has been any mental health follow-up since then.  Today, patient reports that she has been doing well.  She states that she is taking no psychiatric medication.  She states that her primary concern is "getting a letter saying I am mentally fit to go back to work".  I told the patient that such a letter could not be provided based on her not following up regularly at this clinic, and this being my first time meeting her.  It is also certainly pertinent that she has reported medication noncompliance at this visit and her 2 previous visits, and she has appeared to be mentally unwell at both of her previous visits (though she appears to be doing fine today).  I offered that she could come as a walk-in next week, and we could consider some form of documentation at that time.  The patient was frustrated with this answer and abruptly left.  Prior to this the patient did exhibit a linear and logical thought process.  She denied having any concerns.  She denies suicidal thoughts or hallucinations.  I encouraged the patient to schedule follow-up or come see me again for walk-in hours if she so desires.   Identifying Information: Martha Lopez is a 33 y.o. y.o. female with a history of schizoaffective disorder who is an  established patient with [facility] for management of that diagnosis.   Plan:  # Schizoaffective disorder bipolar type Interventions: -- Patient left interview before psychiatric medication could be discussed - Certainly recommend patient take previously prescribed risperidone 1 mg twice daily  Lab work reviewed, unremarkable.  Last EKG 2023, unremarkable.  No urine drug screen.  Patient has previously denied illegal drug use.  Patient was given contact information for behavioral health clinic and was instructed to call 911 for emergencies.   Subjective:  Chief Complaint:  Chief Complaint  Patient presents with   Follow-up    Interval History:  In addition to the information described above, the patient reports doing well over the past 2 months.  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  She denies experiencing any suicidal thoughts or hallucinations.  She is calm throughout the interview but did leave abruptly and seemed frustrated.  Visit Diagnosis:    ICD-10-CM   1. Schizoaffective disorder, bipolar type (HCC)  F25.0       Past Psychiatric History: As described above  Past Medical History:  Past Medical History:  Diagnosis Date   Anxiety    Anxiety    Depression    Depression    Diabetes mellitus without complication (HCC)    Type II   Epistaxis    Migraines    No past surgical history on  file.  Family Psychiatric History: None pertinent  Family History:  Family History  Problem Relation Age of Onset   Hypertension Mother    Gout Father     Social History:  Social History   Socioeconomic History   Marital status: Single    Spouse name: Not on file   Number of children: Not on file   Years of education: Not on file   Highest education level: Not on file  Occupational History   Not on file  Tobacco Use   Smoking status: Never   Smokeless tobacco: Never   Vaping Use   Vaping status: Some Days  Substance and Sexual Activity   Alcohol use: Yes    Comment: socially   Drug use: No   Sexual activity: Never  Other Topics Concern   Not on file  Social History Narrative   Not on file   Social Drivers of Health   Financial Resource Strain: Not on file  Food Insecurity: Not on file  Transportation Needs: Not on file  Physical Activity: Not on file  Stress: Not on file  Social Connections: Not on file    Allergies: No Known Allergies  Current Medications: Current Outpatient Medications  Medication Sig Dispense Refill   busPIRone (BUSPAR) 10 MG tablet Take 1 tablet (10 mg total) by mouth 3 (three) times daily. 90 tablet 3   escitalopram (LEXAPRO) 20 MG tablet Take 1 tablet (20 mg total) by mouth daily. 30 tablet 3   hydrocortisone (ANUSOL-HC) 2.5 % rectal cream Place 1 application rectally 2 (two) times daily. 30 g 0   risperiDONE (RISPERDAL) 1 MG tablet Take 1 tablet (1 mg total) by mouth 2 (two) times daily. 60 tablet 1   risperiDONE (RISPERDAL) 1 MG tablet Take 1 tablet (1 mg total) by mouth 2 (two) times daily. 60 tablet 0   traZODone (DESYREL) 150 MG tablet Take 1 tablet (150 mg total) by mouth at bedtime as needed for sleep. 30 tablet 3   No current facility-administered medications for this visit.     Objective:  Psychiatric Specialty Exam: Physical Exam Constitutional:      Appearance: the patient is not toxic-appearing.  Pulmonary:     Effort: Pulmonary effort is normal.  Neurological:     General: No focal deficit present.     Mental Status: the patient is alert and oriented to person, place, and time.   Review of Systems  Respiratory:  Negative for shortness of breath.   Cardiovascular:  Negative for chest pain.  Gastrointestinal:  Negative for abdominal pain, constipation, diarrhea, nausea and vomiting.  Neurological:  Negative for headaches.      There were no vitals taken for this visit.  General  Appearance: Fairly Groomed  Eye Contact:  Good  Speech:  Clear and Coherent  Volume:  Normal  Mood:  Euthymic  Affect:  Congruent  Thought Process:  Coherent  Orientation:  Full (Time, Place, and Person)  Thought Content: Logical   Suicidal Thoughts:  No  Homicidal Thoughts:  No  Memory:  Immediate;   Good  Judgement: Poor  Insight: Poor  Psychomotor Activity:  Normal  Concentration:  Concentration: Good  Recall:  Good  Fund of Knowledge: Good  Language: Good  Akathisia:  No  Handed:    AIMS (if indicated): not done  Assets:  Communication Skills Desire for Improvement Financial Resources/Insurance Housing Leisure Time Physical Health  ADL's:  Intact  Cognition: WNL  Sleep: Unknown     Metabolic  Disorder Labs: No results found for: "HGBA1C", "MPG" No results found for: "PROLACTIN" No results found for: "CHOL", "TRIG", "HDL", "CHOLHDL", "VLDL", "LDLCALC" No results found for: "TSH"  Therapeutic Level Labs: No results found for: "LITHIUM" No results found for: "VALPROATE" No results found for: "CBMZ"  Screenings: GAD-7    Flowsheet Row Video Visit from 08/29/2022 in [facility] Clinical Support from 01/25/2022 in [facility] Video Visit from 08/20/2021 in [facility] Video Visit from 07/09/2021 in [facility] Video Visit from 02/19/2021 in [facility]  Total GAD-7 Score 15 18 10 18 19       PHQ2-9    Flowsheet Row Video Visit from 08/29/2022 in [facility] Clinical Support from 01/25/2022 in [facility] Video Visit from 08/20/2021 in [facility] Video Visit from 07/09/2021 in [facility] Video Visit from 02/19/2021 in [facility]  PHQ-2 Total Score 4 6 2 6 5   PHQ-9 Total Score 17 20 4 21 18        Flowsheet Row ED from 03/06/2023 in [facility] Video Visit from 08/29/2022 in [facility] Clinical Support from 01/25/2022 in [facility]  C-SSRS RISK CATEGORY No Risk Error: Q3, 4, or 5 should not be populated when Q2 is No Error: Q7 should not be populated when Q6 is No       Collaboration of Care: none  A total of 30 minutes was spent involved in face to face clinical care, chart review, documentation.   Carlyn Reichert, MD 04/21/2023, 8:58 AM

## 2023-05-19 ENCOUNTER — Other Ambulatory Visit (HOSPITAL_COMMUNITY): Payer: Self-pay | Admitting: Psychiatry

## 2023-05-19 DIAGNOSIS — F411 Generalized anxiety disorder: Secondary | ICD-10-CM

## 2023-05-20 ENCOUNTER — Other Ambulatory Visit: Payer: Self-pay

## 2023-05-25 ENCOUNTER — Encounter: Payer: Self-pay | Admitting: Pharmacist

## 2023-05-25 ENCOUNTER — Other Ambulatory Visit: Payer: Self-pay

## 2023-06-01 ENCOUNTER — Other Ambulatory Visit: Payer: Self-pay

## 2023-08-08 ENCOUNTER — Other Ambulatory Visit: Payer: Self-pay

## 2023-08-08 ENCOUNTER — Encounter (HOSPITAL_COMMUNITY): Payer: Self-pay

## 2023-08-08 ENCOUNTER — Emergency Department (HOSPITAL_COMMUNITY)
Admission: EM | Admit: 2023-08-08 | Discharge: 2023-08-09 | Disposition: A | Discharge status: Other Acute Inpatient Hospital | Payer: MEDICAID | Attending: Emergency Medicine | Admitting: Emergency Medicine

## 2023-08-08 DIAGNOSIS — F259 Schizoaffective disorder, unspecified: Secondary | ICD-10-CM | POA: Insufficient documentation

## 2023-08-08 DIAGNOSIS — E119 Type 2 diabetes mellitus without complications: Secondary | ICD-10-CM | POA: Insufficient documentation

## 2023-08-08 DIAGNOSIS — F250 Schizoaffective disorder, bipolar type: Secondary | ICD-10-CM

## 2023-08-08 DIAGNOSIS — F22 Delusional disorders: Secondary | ICD-10-CM | POA: Diagnosis not present

## 2023-08-08 LAB — COMPREHENSIVE METABOLIC PANEL WITH GFR
ALT: 11 U/L (ref 0–44)
AST: 16 U/L (ref 15–41)
Albumin: 4 g/dL (ref 3.5–5.0)
Alkaline Phosphatase: 58 U/L (ref 38–126)
Anion gap: 10 (ref 5–15)
BUN: 7 mg/dL (ref 6–20)
CO2: 25 mmol/L (ref 22–32)
Calcium: 9 mg/dL (ref 8.9–10.3)
Chloride: 102 mmol/L (ref 98–111)
Creatinine, Ser: 0.9 mg/dL (ref 0.44–1.00)
GFR, Estimated: >60 mL/min (ref >60)
Glucose, Bld: 108 mg/dL — ABNORMAL HIGH (ref 70–99)
Potassium: 3.8 mmol/L (ref 3.5–5.1)
Sodium: 137 mmol/L (ref 135–145)
Total Bilirubin: 0.7 mg/dL (ref 0.0–1.2)
Total Protein: 7.4 g/dL (ref 6.5–8.1)

## 2023-08-08 LAB — CBC
HCT: 39.7 % (ref 36.0–46.0)
Hemoglobin: 12.4 g/dL (ref 12.0–15.0)
MCH: 26.1 pg (ref 26.0–34.0)
MCHC: 31.2 g/dL (ref 30.0–36.0)
MCV: 83.4 fL (ref 80.0–100.0)
Platelets: 340 K/uL (ref 150–400)
RBC: 4.76 MIL/uL (ref 3.87–5.11)
RDW: 14.3 % (ref 11.5–15.5)
WBC: 7.3 K/uL (ref 4.0–10.5)
nRBC: 0 % (ref 0.0–0.2)

## 2023-08-08 LAB — RAPID URINE DRUG SCREEN, HOSP PERFORMED
Amphetamines: POSITIVE — AB
Barbiturates: NOT DETECTED
Benzodiazepines: NOT DETECTED
Cocaine: NOT DETECTED
Opiates: NOT DETECTED
Tetrahydrocannabinol: NOT DETECTED

## 2023-08-08 LAB — HCG, SERUM, QUALITATIVE: Preg, Serum: NEGATIVE

## 2023-08-08 LAB — ETHANOL: Alcohol, Ethyl (B): <15 mg/dL (ref <15)

## 2023-08-08 MED ORDER — RISPERIDONE 1 MG PO TABS
1.0000 mg | ORAL_TABLET | Freq: Two times a day (BID) | ORAL | Status: DC
  Administered 2023-08-08: 1 mg via ORAL
  Filled 2023-08-08 (×3): qty 1

## 2023-08-08 MED ORDER — TRAZODONE HCL 50 MG PO TABS: 50.0000 mg | ORAL_TABLET | Freq: Every evening | ORAL | Status: DC | PRN

## 2023-08-08 MED ORDER — HALOPERIDOL 5 MG PO TABS: 5.0000 mg | ORAL_TABLET | Freq: Three times a day (TID) | ORAL | Status: DC | PRN

## 2023-08-08 MED ORDER — HYDROXYZINE HCL 25 MG PO TABS: 25.0000 mg | ORAL_TABLET | Freq: Three times a day (TID) | ORAL | Status: DC | PRN

## 2023-08-08 MED ORDER — HALOPERIDOL LACTATE 5 MG/ML IJ SOLN: 5.0000 mg | Freq: Three times a day (TID) | INTRAMUSCULAR | Status: DC | PRN

## 2023-08-08 NOTE — Progress Notes (Signed)
 Pt has been accepted to [facility] on 08/08/2023 . Bed assignment: [place] [facility]   Pt meets inpatient criteria per Elveria Batter, NP    Attending Physician will be Dr. Jess   Report can be called to: [phone]   Pt can arrive after 8AM  Care Team Notified: Delon Spalding, RN
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
## 2023-08-08 NOTE — Progress Notes (Signed)
 Inpatient Psychiatric Referral  Patient was recommended inpatient per Elveria Batter, NP. There are no available beds at [facility], per [facility] Cherylynn Ernst, RN. Patient was referred to the following out of network facilities:  Destination  Service Provider Request Status Address Phone Fax  [facility]  Pending - Request Sent [address], [place] [address] [phone]  [facility]  Pending - Request Sent [address] [place] [address] [phone]  [facility]  Pending - Request Sent [address] [place] [address] [phone]  [facility]  Pending - Request Sent [address], [place] [address] [phone]  [facility]  Pending - Request Sent [address] [place] [address] [phone]  [facility]  Pending - Request Sent [address] [place] [phone]  [facility]  Pending - Request Sent [address] [place] [address] [phone]    Situation ongoing, CSW to continue following and update chart as more information becomes available.  Lorin Sofia MSW, LCSWA 08/08/2023  7:29PM

## 2023-08-08 NOTE — ED Notes (Signed)
 Security at bedside wanding patient

## 2023-08-08 NOTE — ED Notes (Signed)
 IVC'd 08/08/23, exp 08/15/23; IVC docs copied, filed, faxed and on clipboard in blue zone; patient in 45 (yellow zone).

## 2023-08-08 NOTE — ED Notes (Signed)
 Pt alert, calm and cooperative upon assessment. Pt unable to identify reason for being brought to ED for psych eval.

## 2023-08-08 NOTE — ED Notes (Signed)
 Patient arrived via [employer] already IVC'd; documents taken to both RN and Doctor. Awaiting First Exam doc by Dr Ruthe.

## 2023-08-08 NOTE — ED Notes (Signed)
PD remains at bedside.

## 2023-08-08 NOTE — ED Notes (Signed)
 Sitter bedside, [employer] released.

## 2023-08-08 NOTE — ED Notes (Signed)
 Patient informed on next steps by RN. Patient endorsing verbal threats to this RN. PD remains at bedside.
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
## 2023-08-08 NOTE — ED Notes (Signed)
 IVC'd 08/08/23, exp 08/15/23; IVC docs copied, filed, faxed and in clipboard in blue zone.
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
## 2023-08-09 NOTE — ED Notes (Signed)
 Patient left with sheriffs to go to [facility]. Emtala, TRX Med Necessity, and carelink/transfer summary sent with patient. All belongings sent with patient. Pt A+Ox4, VSS, and ambulatory upon transfer.

## 2023-08-09 NOTE — ED Notes (Signed)
 Sheriff's office notified of patient transfer today. May be transferred today or tomorrow due to 2 other distant transfers scheduled. Sheriff to update us  when they know ETA. Consulting civil engineer notified.

## 2023-08-09 NOTE — ED Notes (Signed)
 Sheriff called for patient pick up

## 2023-08-09 NOTE — ED Provider Notes (Signed)
 Emergency Medicine Observation Re-evaluation Note  Martha Lopez is a 33 y.o. female, seen on rounds today.  Pt initially presented to the ED for complaints of IVC Currently, the patient is awake and alert in no apparent distress.  She presents euthymic at this time, however does not understand her current situation.  Explained current plan to transfer to [facility], she understands this at this time and has no objections to this..  Physical Exam  BP (!) 90/45 (BP Location: Left Arm)   Pulse 78   Temp (!) 97.4 F (36.3 C) (Oral)   Resp 16   Ht 5' 3 (1.6 m)   Wt 72.6 kg   SpO2 100%   BMI 28.35 kg/m  Physical Exam General: Awake alert and oriented x 4. Cardiac: Normal heart rate, rate and rhythm are regular Lungs: Nonlabored breathing with normal rate Psych: Euthymic at this time  ED Course / MDM  EKG:   I have reviewed the labs performed to date as well as medications administered while in observation.  Recent changes in the last 24 hours include no changes.  Plan  Current plan is for transfer to [facility] by [facility].  EMTALA completed,    Myriam Dorn BROCKS, [place] 08/09/23 1247    Simon Lavonia LOISE, MD 08/09/23 [phone]

## 2023-08-09 NOTE — ED Notes (Signed)
 Sheriff on way to get patient.

## 2023-08-09 NOTE — ED Notes (Signed)
 Called [place] Sheriff to request confirmation that patient is on the list for transport.  Confirmed.  Also called [facility] Lopez to see if they were able to pick up the patient to take to [place] but they refused.

## 2023-08-09 NOTE — ED Notes (Signed)
 Patient is leaving to [facility] ivc documents given to Lobbyist

## 2023-08-21 ENCOUNTER — Encounter (HOSPITAL_COMMUNITY): Payer: MEDICAID | Admitting: Physician Assistant

## 2023-12-03 ENCOUNTER — Other Ambulatory Visit: Payer: Self-pay

## 2023-12-03 ENCOUNTER — Encounter (HOSPITAL_COMMUNITY): Payer: Self-pay

## 2023-12-03 ENCOUNTER — Emergency Department (HOSPITAL_COMMUNITY)
Admission: EM | Admit: 2023-12-03 | Discharge: 2023-12-03 | Disposition: A | Discharge status: Home / Self Care | Payer: MEDICAID | Attending: Emergency Medicine | Admitting: Emergency Medicine

## 2023-12-03 DIAGNOSIS — M545 Low back pain, unspecified: Secondary | ICD-10-CM | POA: Insufficient documentation

## 2023-12-03 DIAGNOSIS — M549 Dorsalgia, unspecified: Secondary | ICD-10-CM

## 2023-12-03 MED ORDER — KETOROLAC TROMETHAMINE 15 MG/ML IJ SOLN
15.0000 mg | Freq: Once | INTRAMUSCULAR | Status: AC
  Administered 2023-12-03: 15 mg via INTRAMUSCULAR
  Filled 2023-12-03: qty 1

## 2023-12-03 MED ORDER — CYCLOBENZAPRINE HCL 10 MG PO TABS: 10.0000 mg | ORAL_TABLET | Freq: Two times a day (BID) | ORAL | 0 refills | Status: AC | PRN

## 2023-12-03 MED ORDER — LIDOCAINE 5 % EX PTCH
1.0000 | MEDICATED_PATCH | CUTANEOUS | 0 refills | Status: DC
  Filled 2023-12-03: qty 30, 30d supply, fill #0

## 2023-12-03 MED ORDER — LIDOCAINE 5 % EX PTCH
1.0000 | MEDICATED_PATCH | CUTANEOUS | Status: DC
  Administered 2023-12-03: 1 via TRANSDERMAL
  Filled 2023-12-03: qty 1

## 2023-12-03 MED ORDER — CYCLOBENZAPRINE HCL 10 MG PO TABS
10.0000 mg | ORAL_TABLET | Freq: Two times a day (BID) | ORAL | 0 refills | Status: DC | PRN
  Filled 2023-12-03: qty 20, 10d supply, fill #0

## 2023-12-03 MED ORDER — LIDOCAINE 5 % EX PTCH: 1.0000 | MEDICATED_PATCH | CUTANEOUS | 0 refills | Status: AC

## 2023-12-03 NOTE — Discharge Instructions (Signed)
 Evaluation of your back pain was overall reassuring.  Suspect this is either a pulled muscle or could be sciatica.  Recommend continued use of ibuprofen , applying ice 3-4 times a day and you can take the muscle relaxers if you need them at night as they will make you drowsy.  Also sent Lidoderm  patches as well.  Please follow-up your PCP for ongoing treatment of your back pain.

## 2023-12-03 NOTE — ED Triage Notes (Signed)
 Pt c.o bilateral lower back pain x 2 months. Pt denies urinary s.s. Denies recent injury. Pt also c.o right leg pain x 6 months.

## 2023-12-03 NOTE — ED Provider Notes (Signed)
 [place] [facility] AT [facility] Provider Note   CSN: [id number] Arrival date & time: 12/03/23  9057     Patient presents with: Back Pain and Leg Pain  HPI Martha Lopez is a 33 y.o. female presenting for back pain.  Started about 4 months ago.  Pain is in the right lower back and at times radiates down the right lower leg.  She denies saddle anesthesia, urinary or bowel changes, fever, IV drug use or recent trauma.  Symptoms are made worse with walking but she is still able to ambulate and bear weight.  She describes the pain as a shooting sharp pain.  Denies any radiation of that pain into the abdomen.  Took ibuprofen  2 days ago with some relief.    Back Pain Associated symptoms: leg pain   Leg Pain Associated symptoms: back pain        Prior to Admission medications   Medication Sig Start Date End Date Taking? Authorizing Provider  cyclobenzaprine  (FLEXERIL ) 10 MG tablet Take 1 tablet (10 mg total) by mouth 2 (two) times daily as needed for muscle spasms. 12/03/23  Yes Racheal Mathurin K, PA-C  lidocaine  (LIDODERM ) 5 % Place 1 patch onto the skin daily. Remove & Discard patch within 12 hours or as directed by MD 12/03/23  Yes Lang Norleen POUR, PA-C  busPIRone  (BUSPAR ) 10 MG tablet Take 1 tablet (10 mg total) by mouth 3 (three) times daily. Patient not taking: Reported on 08/09/2023 01/25/22   Harl Zane BRAVO, NP  escitalopram  (LEXAPRO ) 20 MG tablet Take 1 tablet (20 mg total) by mouth daily. Patient not taking: Reported on 08/09/2023 08/29/22   Parsons, Brittney E, NP  hydrocortisone  (ANUSOL -HC) 2.5 % rectal cream Place 1 application rectally 2 (two) times daily. Patient not taking: Reported on 08/09/2023 12/08/20   Jerrol Agent, MD  risperiDONE  (RISPERDAL ) 1 MG tablet Take 1 tablet (1 mg total) by mouth 2 (two) times daily. Patient not taking: Reported on 08/09/2023 03/06/23   Cleotilde Rogue, MD  risperiDONE  (RISPERDAL ) 1 MG tablet Take 1 tablet (1 mg total)  by mouth 2 (two) times daily. Patient not taking: Reported on 08/09/2023 03/06/23   Mardy Legacy, NP  traZODone  (DESYREL ) 150 MG tablet Take 1 tablet (150 mg total) by mouth at bedtime as needed for sleep. Patient not taking: Reported on 08/09/2023 08/29/22   Harl Zane BRAVO, NP    Allergies: Patient has no known allergies.    Review of Systems  Musculoskeletal:  Positive for back pain.    Updated Vital Signs BP 113/72 (BP Location: Right Arm)   Pulse 90   Temp (!) 97.5 F (36.4 C)   Resp 17   Ht 5' 3 (1.6 m)   LMP 10/29/2023 (Approximate)   SpO2 100%   BMI 28.35 kg/m   Physical Exam Constitutional:      Appearance: Normal appearance.  HENT:     Head: Normocephalic.     Nose: Nose normal.  Eyes:     Conjunctiva/sclera: Conjunctivae normal.  Pulmonary:     Effort: Pulmonary effort is normal.  Musculoskeletal:       Back:     Comments: No spinous tenderness of the lower back. Actively flexed, extended and rotated her back without issue.  No erythema or edema noted.  Steady gait.  Neurological:     Mental Status: She is alert.  Psychiatric:        Mood and Affect: Mood normal.     (all  labs ordered are listed, but only abnormal results are displayed) Labs Reviewed - No data to display  EKG: None  Radiology: No results found.   Procedures   Medications Ordered in the ED  ketorolac  (TORADOL ) 15 MG/ML injection 15 mg (has no administration in time range)  lidocaine  (LIDODERM ) 5 % 1 patch (has no administration in time range)                                    Medical Decision Making  33 year old well-appearing female presenting for back pain.  Exam notable for tenderness overlying the right sciatic notch which elicited shooting pain down the right leg.  Suspect this could be sciatica versus a pulled muscle.  Denied red flag symptoms of back pain.  For now, advised supportive treatment with NSAIDs.  Also sent Lidoderm  patches and muscle relaxers to her  pharmacy at her request.  Advised her to follow-up with PCP.  Discussed return precautions.  Discharge.     Final diagnoses:  Back pain, unspecified back location, unspecified back pain laterality, unspecified chronicity    ED Discharge Orders          Ordered    cyclobenzaprine  (FLEXERIL ) 10 MG tablet  2 times daily PRN        12/03/23 1149    lidocaine  (LIDODERM ) 5 %  Every 24 hours        12/03/23 1149               Lang Norleen POUR, PA-C 12/03/23 1150    Cottie Donnice PARAS, MD 12/03/23 1306

## 2023-12-04 ENCOUNTER — Other Ambulatory Visit: Payer: Self-pay

## 2023-12-07 ENCOUNTER — Emergency Department (HOSPITAL_COMMUNITY)
Admission: EM | Admit: 2023-12-07 | Discharge: 2023-12-08 | Disposition: A | Discharge status: Home / Self Care | Payer: MEDICAID | Attending: Emergency Medicine | Admitting: Emergency Medicine

## 2023-12-07 DIAGNOSIS — R109 Unspecified abdominal pain: Secondary | ICD-10-CM | POA: Diagnosis not present

## 2023-12-07 DIAGNOSIS — M545 Low back pain, unspecified: Secondary | ICD-10-CM | POA: Diagnosis present

## 2023-12-07 DIAGNOSIS — D72829 Elevated white blood cell count, unspecified: Secondary | ICD-10-CM | POA: Diagnosis not present

## 2023-12-07 DIAGNOSIS — R319 Hematuria, unspecified: Secondary | ICD-10-CM | POA: Insufficient documentation

## 2023-12-07 DIAGNOSIS — M5431 Sciatica, right side: Secondary | ICD-10-CM | POA: Diagnosis not present

## 2023-12-07 DIAGNOSIS — G8929 Other chronic pain: Secondary | ICD-10-CM

## 2023-12-07 LAB — BASIC METABOLIC PANEL WITH GFR
Anion gap: 13 (ref 5–15)
BUN: 7 mg/dL (ref 6–20)
CO2: 24 mmol/L (ref 22–32)
Calcium: 9.4 mg/dL (ref 8.9–10.3)
Chloride: 100 mmol/L (ref 98–111)
Creatinine, Ser: 0.85 mg/dL (ref 0.44–1.00)
GFR, Estimated: >60 mL/min (ref >60)
Glucose, Bld: 107 mg/dL — ABNORMAL HIGH (ref 70–99)
Potassium: 3.5 mmol/L (ref 3.5–5.1)
Sodium: 137 mmol/L (ref 135–145)

## 2023-12-07 LAB — CBC WITH DIFFERENTIAL/PLATELET
Abs Immature Granulocytes: 0.04 K/uL (ref 0.00–0.07)
Basophils Absolute: 0.1 K/uL (ref 0.0–0.1)
Basophils Relative: 1 %
Eosinophils Absolute: 0 K/uL (ref 0.0–0.5)
Eosinophils Relative: 0 %
HCT: 41.6 % (ref 36.0–46.0)
Hemoglobin: 12.9 g/dL (ref 12.0–15.0)
Immature Granulocytes: 0 %
Lymphocytes Relative: 21 %
Lymphs Abs: 2.8 K/uL (ref 0.7–4.0)
MCH: 26 pg (ref 26.0–34.0)
MCHC: 31 g/dL (ref 30.0–36.0)
MCV: 83.7 fL (ref 80.0–100.0)
Monocytes Absolute: 1.3 K/uL — ABNORMAL HIGH (ref 0.1–1.0)
Monocytes Relative: 9 %
Neutro Abs: 9.3 K/uL — ABNORMAL HIGH (ref 1.7–7.7)
Neutrophils Relative %: 69 %
Platelets: 395 K/uL (ref 150–400)
RBC: 4.97 MIL/uL (ref 3.87–5.11)
RDW: 14.4 % (ref 11.5–15.5)
WBC: 13.5 K/uL — ABNORMAL HIGH (ref 4.0–10.5)
nRBC: 0 % (ref 0.0–0.2)

## 2023-12-07 LAB — HCG, SERUM, QUALITATIVE: Preg, Serum: NEGATIVE

## 2023-12-07 NOTE — ED Triage Notes (Signed)
 Pt c/o blood in urine with lower back pain with blood in urine x 4 weeks, denies fevers

## 2023-12-08 ENCOUNTER — Emergency Department (HOSPITAL_COMMUNITY): Payer: MEDICAID

## 2023-12-08 LAB — HEPATIC FUNCTION PANEL
ALT: 12 U/L (ref 0–44)
AST: 18 U/L (ref 15–41)
Albumin: 4.3 g/dL (ref 3.5–5.0)
Alkaline Phosphatase: 54 U/L (ref 38–126)
Bilirubin, Direct: <0.1 mg/dL (ref 0.0–0.2)
Total Bilirubin: 0.6 mg/dL (ref 0.0–1.2)
Total Protein: 8.1 g/dL (ref 6.5–8.1)

## 2023-12-08 LAB — URINALYSIS, ROUTINE W REFLEX MICROSCOPIC
Bilirubin Urine: NEGATIVE
Glucose, UA: NEGATIVE mg/dL
Hgb urine dipstick: NEGATIVE
Ketones, ur: NEGATIVE mg/dL
Leukocytes,Ua: NEGATIVE
Nitrite: NEGATIVE
Protein, ur: NEGATIVE mg/dL
Specific Gravity, Urine: 1.004 — ABNORMAL LOW (ref 1.005–1.030)
pH: 7 (ref 5.0–8.0)

## 2023-12-08 LAB — LIPASE, BLOOD: Lipase: 34 U/L (ref 11–51)

## 2023-12-08 NOTE — ED Provider Notes (Signed)
  EMERGENCY DEPARTMENT AT [facility] Provider Note   CSN: [id number] Arrival date & time: 12/07/23  2208     Patient presents with: Hematuria   Martha Lopez is a 33 y.o. female who presents with concern for right lower back pain that radiates down the right leg with some numbness and ting down the right leg without saddle anesthesia, urinary fecal incontinence or retention who presents with concern for single episode of hematuria 3 days ago without any dysuria, frequency, urgency, or vaginal bleeding.  No fevers or chills.  Was previously diagnosed with sciatica on the right side but expresses significant anxiety today perhaps her pain is originating in her kidneys.  History of anxiety and depression as well as bipolar disorder.  No anticoagulation.  Patient denies any daily medications at all. Denies IVDU, no immunocompromising conditions or medications daily. No recent instrumentation to the back  HPI     Prior to Admission medications   Medication Sig Start Date End Date Taking? Authorizing Provider  busPIRone  (BUSPAR ) 10 MG tablet Take 1 tablet (10 mg total) by mouth 3 (three) times daily. Patient not taking: Reported on 08/09/2023 01/25/22   Harl Zane BRAVO, NP  cyclobenzaprine  (FLEXERIL ) 10 MG tablet Take 1 tablet (10 mg total) by mouth 2 (two) times daily as needed for muscle spasms. 12/03/23   Robinson, John K, PA-C  escitalopram  (LEXAPRO ) 20 MG tablet Take 1 tablet (20 mg total) by mouth daily. Patient not taking: Reported on 08/09/2023 08/29/22   Parsons, Brittney E, NP  hydrocortisone  (ANUSOL -HC) 2.5 % rectal cream Place 1 application rectally 2 (two) times daily. Patient not taking: Reported on 08/09/2023 12/08/20   Jerrol Agent, MD  lidocaine  (LIDODERM ) 5 % Place 1 patch onto the skin daily. Remove & Discard patch within 12 hours or as directed by MD 12/03/23   Lang Norleen POUR, PA-C  risperiDONE  (RISPERDAL ) 1 MG tablet Take 1 tablet (1 mg total) by mouth  2 (two) times daily. Patient not taking: Reported on 08/09/2023 03/06/23   Cleotilde Rogue, MD  risperiDONE  (RISPERDAL ) 1 MG tablet Take 1 tablet (1 mg total) by mouth 2 (two) times daily. Patient not taking: Reported on 08/09/2023 03/06/23   Mardy Legacy, NP  traZODone  (DESYREL ) 150 MG tablet Take 1 tablet (150 mg total) by mouth at bedtime as needed for sleep. Patient not taking: Reported on 08/09/2023 08/29/22   Harl Zane BRAVO, NP    Allergies: Patient has no known allergies.    Review of Systems  Genitourinary:  Positive for flank pain and hematuria.  Musculoskeletal:  Positive for back pain.    Updated Vital Signs BP 133/86 (BP Location: Right Arm)   Pulse 93   Temp 98.5 F (36.9 C) (Oral)   Resp 18   LMP 10/29/2023 (Approximate)   SpO2 100%   Physical Exam Vitals and nursing note reviewed.  Constitutional:      Appearance: She is not ill-appearing or toxic-appearing.  HENT:     Head: Normocephalic and atraumatic.     Mouth/Throat:     Mouth: Mucous membranes are moist.     Pharynx: No oropharyngeal exudate or posterior oropharyngeal erythema.  Eyes:     General:        Right eye: No discharge.        Left eye: No discharge.     Conjunctiva/sclera: Conjunctivae normal.  Cardiovascular:     Rate and Rhythm: Normal rate and regular rhythm.     Pulses: Normal  pulses.     Heart sounds: Normal heart sounds. No murmur heard. Pulmonary:     Effort: Pulmonary effort is normal. No respiratory distress.     Breath sounds: Normal breath sounds. No wheezing or rales.  Abdominal:     General: Bowel sounds are normal. There is no distension.     Palpations: Abdomen is soft.     Tenderness: There is no abdominal tenderness. There is no guarding or rebound.  Musculoskeletal:        General: No deformity.     Cervical back: Normal and neck supple. No bony tenderness.     Thoracic back: Normal. No bony tenderness.     Lumbar back: Spasms and tenderness present. No bony  tenderness.     Right lower leg: No edema.     Comments: Right-sided lumbar muscular spasm and tenderness palpation without midline tenderness palpation.  Symmetric strength and sensation bilateral lower extremities, patient ambulatory in the emergency department without difficulty.  Skin:    General: Skin is warm and dry.     Capillary Refill: Capillary refill takes less than 2 seconds.     Findings: No rash.     Comments: No rash on the R back.   Neurological:     General: No focal deficit present.     Mental Status: She is alert and oriented to person, place, and time. Mental status is at baseline.     Sensory: Sensation is intact.     Motor: Motor function is intact.     Gait: Gait is intact.  Psychiatric:        Mood and Affect: Mood normal.     (all labs ordered are listed, but only abnormal results are displayed) Labs Reviewed  CBC WITH DIFFERENTIAL/PLATELET - Abnormal; Notable for the following components:      Result Value   WBC 13.5 (*)    Neutro Abs 9.3 (*)    Monocytes Absolute 1.3 (*)    All other components within normal limits  BASIC METABOLIC PANEL WITH GFR - Abnormal; Notable for the following components:   Glucose, Bld 107 (*)    All other components within normal limits  HCG, SERUM, QUALITATIVE  URINALYSIS, ROUTINE W REFLEX MICROSCOPIC  HEPATIC FUNCTION PANEL  LIPASE, BLOOD    EKG: None  Radiology: CT Renal Stone Study Result Date: 12/08/2023 EXAM: CT ABDOMEN AND PELVIS WITHOUT CONTRAST 12/08/2023 05:57:18 AM TECHNIQUE: CT of the abdomen and pelvis was performed without the administration of intravenous contrast. Multiplanar reformatted images are provided for review. Automated exposure control, iterative reconstruction, and/or weight-based adjustment of the mA/kV was utilized to reduce the radiation dose to as low as reasonably achievable. COMPARISON: 12/08/2020 CLINICAL HISTORY: Abdominal/flank pain, stone suspected. FINDINGS: LOWER CHEST: No acute  abnormality. LIVER: The liver is unremarkable. GALLBLADDER AND BILE DUCTS: Gallbladder is unremarkable. No biliary ductal dilatation. SPLEEN: No acute abnormality. PANCREAS: No acute abnormality. ADRENAL GLANDS: No acute abnormality. KIDNEYS, URETERS AND BLADDER: No stones in the kidneys or ureters. No hydronephrosis. No perinephric or periureteral stranding. Urinary bladder is unremarkable. GI AND BOWEL: Stomach demonstrates no acute abnormality. There is no bowel obstruction. The appendix is visualized and normal in caliber, without wall thickening, periappendiceal inflammation, or fluid. PERITONEUM AND RETROPERITONEUM: No ascites. No free air. VASCULATURE: Aorta is normal in caliber. LYMPH NODES: No lymphadenopathy. REPRODUCTIVE ORGANS: The uterus and adnexal structures are unremarkable. BONES AND SOFT TISSUES: No acute osseous abnormality. No focal soft tissue abnormality. IMPRESSION: 1. No acute findings in  the abdomen or pelvis. Electronically signed by: Waddell Calk MD 12/08/2023 06:12 AM EST RP Workstation: [id number]     Procedures   Medications Ordered in the ED - No data to display                                  Medical Decision Making 33 year old female with hematuria and lower back pain on the R side.   HTN on intake, VS otherwise normal. Cardiopulmonary exam and abdominal exams are benign.   The differential diagnosis of emergent flank pain includes, but is not limited to: Nephrolithiasis/ Renal Colic, Pyelonephritis, Abdominal aortic aneurysm, Aortic dissection, Renal artery embolism, Renal vein thrombosis, Renal infarction, Renal hemorrhage, Mesenteric ischemia, Bladder tumor, Cystitis, Biliary colic, Pancreatitis, Perforated peptic ulcer,  Appendicitis, Inguinal Hernia, Diverticulitis, Bowel obstruction. Shingles, Lower lobe pneumonia, Retroperitoneal hematoma/abscess/tumor, Epidural abscess, Epidural hematoma.  Particularly in females it is important to consider (Ectopic  Pregnancy,PID/TOA,Ovarian cyst, Ovarian torsion, STD)     Amount and/or Complexity of Data Reviewed Labs: ordered.    Details: CBC with mild leukocytosis of 13.5 .  BMP unremarkable, pending remainder of laboratory studies at time of shift change. Radiology: ordered.    Details:   CT renal study is negative.   Clinical picture most consistent with sciatica, however pending remainder of laboratory studies at time of shift change. Care of this patient signed out to oncoming ED provider DOROTHA Dec, PA-C at time of shift change. All pertinent HPI, physical exam, and laboratory findings were discussed with them prior to my departure. Disposition of patient pending completion of workup, reevaluation, and clinical judgement of oncoming ED provider.   This chart was dictated using voice recognition software, Dragon. Despite the best efforts of this provider to proofread and correct errors, errors may still occur which can change documentation meaning.      Final diagnoses:  None    ED Discharge Orders     None          Bobette Pleasant JONELLE DEVONNA 12/08/23 [id number]    Midge Golas, MD 12/11/23 [phone]

## 2023-12-08 NOTE — ED Provider Notes (Signed)
  Physical Exam  BP 133/86 (BP Location: Right Arm)   Pulse 93   Temp 98.5 F (36.9 C) (Oral)   Resp 18   LMP 10/29/2023 (Approximate)   SpO2 100%   Physical Exam Vitals and nursing note reviewed.  Constitutional:      General: She is not in acute distress.    Appearance: She is well-developed.  HENT:     Head: Normocephalic and atraumatic.  Eyes:     Conjunctiva/sclera: Conjunctivae normal.  Cardiovascular:     Rate and Rhythm: Normal rate and regular rhythm.     Heart sounds: No murmur heard. Pulmonary:     Effort: Pulmonary effort is normal. No respiratory distress.     Breath sounds: Normal breath sounds.  Abdominal:     Palpations: Abdomen is soft.     Tenderness: There is no abdominal tenderness.  Musculoskeletal:        General: No swelling.     Cervical back: Neck supple.  Skin:    General: Skin is warm and dry.     Capillary Refill: Capillary refill takes less than 2 seconds.  Neurological:     Mental Status: She is alert.  Psychiatric:        Mood and Affect: Mood normal.     Procedures  Procedures  ED Course / MDM    Medical Decision Making Amount and/or Complexity of Data Reviewed Labs: ordered. Radiology: ordered.   Assumed care from R.Sponseller, PA-C.  Martha Lopez is a 33 year old female who presents to the ED for painless hematuria and lumbar discomfort over the last 4 weeks.  Previous concerns for sciatica however denied any concerning findings such as saddle anesthesia, urinary or fecal incontinence.  Workup in process care assumption, does demonstrate a leukocytosis of 13.5, however urine was pending as was the lipase and hepatic function panel.  Urine did not show any concerning findings nor did the lipase or hepatic function panel.  Given reassuring physical examination as well as no clear findings on imaging obtained nor on the remainder of the lab workup, plan at this time is to discharge with outpatient follow-up to primary care for  painless hematuria, sciatica.  This was thoroughly discussed with the patient, they understand and agree of no further concerns at this time.  Understand follow-up with primary care, careful return precautions were given to the patient and she voices understanding and agreement.       Myriam Dorn BROCKS, PA 12/08/23 0800    Rogelia Jerilynn RAMAN, MD 12/08/23 [phone]

## 2023-12-08 NOTE — Discharge Instructions (Signed)
 As discussed, please follow-up with your primary care, return to the emergency department should you have any other concerning symptoms such as increasing pain with urination, increasing back or abdominal pain, or any other new and concerning symptoms prior to your evaluation by your primary care.

## 2023-12-12 ENCOUNTER — Encounter (HOSPITAL_COMMUNITY): Payer: Self-pay | Admitting: Emergency Medicine

## 2023-12-12 ENCOUNTER — Emergency Department (HOSPITAL_COMMUNITY)
Admission: EM | Admit: 2023-12-12 | Discharge: 2023-12-13 | Disposition: A | Discharge status: Home / Self Care | Payer: MEDICAID | Attending: Emergency Medicine | Admitting: Emergency Medicine

## 2023-12-12 DIAGNOSIS — R200 Anesthesia of skin: Secondary | ICD-10-CM | POA: Insufficient documentation

## 2023-12-12 DIAGNOSIS — R202 Paresthesia of skin: Secondary | ICD-10-CM | POA: Insufficient documentation

## 2023-12-12 LAB — CBC WITH DIFFERENTIAL/PLATELET
Abs Immature Granulocytes: 0.04 K/uL (ref 0.00–0.07)
Basophils Absolute: 0.1 K/uL (ref 0.0–0.1)
Basophils Relative: 1 %
Eosinophils Absolute: 0 K/uL (ref 0.0–0.5)
Eosinophils Relative: 0 %
HCT: 37.7 % (ref 36.0–46.0)
Hemoglobin: 11.7 g/dL — ABNORMAL LOW (ref 12.0–15.0)
Immature Granulocytes: 0 %
Lymphocytes Relative: 15 %
Lymphs Abs: 1.4 K/uL (ref 0.7–4.0)
MCH: 25.9 pg — ABNORMAL LOW (ref 26.0–34.0)
MCHC: 31 g/dL (ref 30.0–36.0)
MCV: 83.6 fL (ref 80.0–100.0)
Monocytes Absolute: 1.3 K/uL — ABNORMAL HIGH (ref 0.1–1.0)
Monocytes Relative: 13 %
Neutro Abs: 6.7 K/uL (ref 1.7–7.7)
Neutrophils Relative %: 71 %
Platelets: 334 K/uL (ref 150–400)
RBC: 4.51 MIL/uL (ref 3.87–5.11)
RDW: 14.2 % (ref 11.5–15.5)
WBC: 9.5 K/uL (ref 4.0–10.5)
nRBC: 0 % (ref 0.0–0.2)

## 2023-12-12 LAB — URINALYSIS, ROUTINE W REFLEX MICROSCOPIC
Bilirubin Urine: NEGATIVE
Glucose, UA: NEGATIVE mg/dL
Hgb urine dipstick: NEGATIVE
Ketones, ur: NEGATIVE mg/dL
Leukocytes,Ua: NEGATIVE
Nitrite: NEGATIVE
Protein, ur: NEGATIVE mg/dL
Specific Gravity, Urine: 1.02 (ref 1.005–1.030)
pH: 5 (ref 5.0–8.0)

## 2023-12-12 LAB — COMPREHENSIVE METABOLIC PANEL WITH GFR
ALT: 9 U/L (ref 0–44)
AST: 15 U/L (ref 15–41)
Albumin: 3.8 g/dL (ref 3.5–5.0)
Alkaline Phosphatase: 53 U/L (ref 38–126)
Anion gap: 11 (ref 5–15)
BUN: <5 mg/dL — ABNORMAL LOW (ref 6–20)
CO2: 24 mmol/L (ref 22–32)
Calcium: 8.8 mg/dL — ABNORMAL LOW (ref 8.9–10.3)
Chloride: 102 mmol/L (ref 98–111)
Creatinine, Ser: 0.76 mg/dL (ref 0.44–1.00)
GFR, Estimated: >60 mL/min (ref >60)
Glucose, Bld: 99 mg/dL (ref 70–99)
Potassium: 3.9 mmol/L (ref 3.5–5.1)
Sodium: 137 mmol/L (ref 135–145)
Total Bilirubin: 0.5 mg/dL (ref 0.0–1.2)
Total Protein: 7.1 g/dL (ref 6.5–8.1)

## 2023-12-12 NOTE — ED Triage Notes (Signed)
 Pt reports that there is blood in her stool and that last week she was here for blood in urine.

## 2023-12-13 ENCOUNTER — Emergency Department (HOSPITAL_COMMUNITY): Payer: MEDICAID

## 2023-12-13 LAB — HCG, SERUM, QUALITATIVE: Preg, Serum: NEGATIVE

## 2023-12-13 LAB — POC OCCULT BLOOD, ED: Fecal Occult Bld: NEGATIVE

## 2023-12-13 MED ORDER — LORAZEPAM 1 MG PO TABS
1.0000 mg | ORAL_TABLET | Freq: Once | ORAL | Status: AC
  Administered 2023-12-13: 1 mg via ORAL
  Filled 2023-12-13: qty 1

## 2023-12-13 MED ORDER — LACTATED RINGERS IV BOLUS
1000.0000 mL | Freq: Once | INTRAVENOUS | Status: AC
  Administered 2023-12-13: 1000 mL via INTRAVENOUS

## 2023-12-13 NOTE — Discharge Instructions (Signed)
 Please follow-up with your orthopedic doctor regarding your numbness and tingling sensation in the arm and leg.  There is no evidence of stroke.  This is likely a pinched nerve causing your symptoms.  Your rectal exam was negative for blood, but please follow-up with your doctor for this, as she may need follow-up with a gastroenterologist.  Please return for new or worsening symptoms.

## 2023-12-13 NOTE — ED Provider Notes (Signed)
 [facility]-EMERGENCY DEPT [facility] Emergency Department Provider Note MRN:  [id number]  Arrival date & time: 12/13/23     Chief Complaint   Rectal Bleeding and Numbness   History of Present Illness   Martha Lopez is a 33 y.o. year-old female presents to the ED with chief complaint of numbness and tingling in the right upper and lower extremities.  Onset was last night.  She also had some of the same in the RLE last week.   States that she hasn't been seen in follow-up.  Reports that she had some blood in her urine earlier in the week as well and now thinks that it is in her stool.  She states that she saw a blood clot.  States that she has been very nervous about her symptoms and wants more done.  History provided by patient.   Review of Systems  Pertinent positive and negative review of systems noted in HPI.    Physical Exam   Vitals:   12/13/23 0224 12/13/23 0230  BP: 126/83 (!) 130/94  Pulse: 95 (!) 101  Resp: 17 19  Temp: 98 F (36.7 C)   SpO2: 100% 100%    CONSTITUTIONAL:  non toxic, but anxious-appearing, NAD NEURO:  Alert and oriented x 3, CN 3-12 grossly intact EYES:  eyes equal and reactive ENT/NECK:  Supple, no stridor  CARDIO:  tachycardic, regular rhythm, appears well-perfused  PULM:  No respiratory distress, CTAB GI/GU:  non-distended,  MSK/SPINE:  No gross deformities, no edema, moves all extremities  SKIN:  no rash, atraumatic   *Additional and/or pertinent findings included in MDM below  Diagnostic and Interventional Summary    EKG Interpretation Date/Time:    Ventricular Rate:    PR Interval:    QRS Duration:    QT Interval:    QTC Calculation:   R Axis:      Text Interpretation:         Labs Reviewed  COMPREHENSIVE METABOLIC PANEL WITH GFR - Abnormal; Notable for the following components:      Result Value   BUN <5 (*)    Calcium 8.8 (*)    All other components within normal limits  CBC WITH DIFFERENTIAL/PLATELET - Abnormal;  Notable for the following components:   Hemoglobin 11.7 (*)    MCH 25.9 (*)    Monocytes Absolute 1.3 (*)    All other components within normal limits  URINALYSIS, ROUTINE W REFLEX MICROSCOPIC - Abnormal; Notable for the following components:   APPearance HAZY (*)    Bacteria, UA RARE (*)    All other components within normal limits  HCG, SERUM, QUALITATIVE  POC OCCULT BLOOD, ED    CT HEAD WO CONTRAST ( )  Final Result    CT Cervical Spine Wo Contrast  Final Result      Medications  lactated ringers  bolus 1,000 mL (1,000 mLs Intravenous New Bag/Given 12/13/23 0240)  LORazepam  (ATIVAN ) tablet 1 mg (1 mg Oral Given 12/13/23 0232)     Procedures  /  Critical Care Procedures  ED Course and Medical Decision Making  I have reviewed the triage vital signs, the nursing notes, and pertinent available records from the EMR.  Social Determinants Affecting Complexity of Care: Patient has no clinically significant social determinants affecting this chief complaint..   ED Course: Clinical Course as of 12/13/23 0407  Wed Dec 13, 2023  0406 Comprehensive metabolic panel(!) [RB]  0406 CBC with Differential(!) [RB]  0406 Urinalysis, Routine w reflex microscopic -Urine, Clean  Catch(!) [RB]  0406 hCG, serum, qualitative [RB]  0406 POC occult blood, ED [RB]  0406 CT Cervical Spine Wo Contrast [RB]  0406 CT HEAD WO CONTRAST ( ) [RB]    Clinical Course User Index [RB] Vicky Charleston, PA-C    Medical Decision Making Patient here with complaints of rectal bleeding and right arm and leg numbness sensation.  She does not have any weakness.  Strength is normal bilaterally.  She had a negative POC occult blood test.  There was no gross blood on rectal exam.  Chaperone was present.  Labs are notable for slight drop in hemoglobin, I have recommended that she follow-up with her PCP, she may need to have referral to GI.  She states that she is getting ready to start her menstrual cycle, so  that is also possibility for her seeing some blood in the toilet.  She did initially seem quite anxious, but seems more calm now.  Heart rate has decreased as she is calm down.  Feel that she is stable for discharge and outpatient follow-up.  Amount and/or Complexity of Data Reviewed Labs: ordered. Decision-making details documented in ED Course. Radiology: ordered. Decision-making details documented in ED Course. ECG/medicine tests: ordered.  Risk Prescription drug management.         Consultants: No consultations were needed in caring for this patient.   Treatment and Plan: I considered admission due to patient's initial presentation, but after considering the examination and diagnostic results, patient will not require admission and can be discharged with outpatient follow-up.    Final Clinical Impressions(s) / ED Diagnoses     ICD-10-CM   1. Paresthesia  R20.2       ED Discharge Orders     None         Discharge Instructions Discussed with and Provided to Patient:     Discharge Instructions      Please follow-up with your orthopedic doctor regarding your numbness and tingling sensation in the arm and leg.  There is no evidence of stroke.  This is likely a pinched nerve causing your symptoms.  Your rectal exam was negative for blood, but please follow-up with your doctor for this, as she may need follow-up with a gastroenterologist.  Please return for new or worsening symptoms.       Vicky Charleston, PA-C 12/13/23 0407    Emil Share, DO 12/13/23 [phone]

## 2023-12-14 ENCOUNTER — Emergency Department (HOSPITAL_COMMUNITY): Payer: MEDICAID

## 2023-12-14 ENCOUNTER — Other Ambulatory Visit: Payer: Self-pay

## 2023-12-14 ENCOUNTER — Emergency Department (HOSPITAL_COMMUNITY)
Admission: EM | Admit: 2023-12-14 | Discharge: 2023-12-15 | Disposition: A | Discharge status: Home / Self Care | Payer: MEDICAID | Attending: Emergency Medicine | Admitting: Emergency Medicine

## 2023-12-14 DIAGNOSIS — R079 Chest pain, unspecified: Secondary | ICD-10-CM

## 2023-12-14 LAB — BASIC METABOLIC PANEL WITH GFR
Anion gap: 12 (ref 5–15)
BUN: 14 mg/dL (ref 6–20)
CO2: 22 mmol/L (ref 22–32)
Calcium: 9.3 mg/dL (ref 8.9–10.3)
Chloride: 103 mmol/L (ref 98–111)
Creatinine, Ser: 0.83 mg/dL (ref 0.44–1.00)
GFR, Estimated: >60 mL/min
Glucose, Bld: 97 mg/dL (ref 70–99)
Potassium: 4.1 mmol/L (ref 3.5–5.1)
Sodium: 137 mmol/L (ref 135–145)

## 2023-12-14 LAB — HCG, SERUM, QUALITATIVE: Preg, Serum: NEGATIVE

## 2023-12-14 LAB — CBC
HCT: 36.2 % (ref 36.0–46.0)
Hemoglobin: 11.3 g/dL — ABNORMAL LOW (ref 12.0–15.0)
MCH: 26.1 pg (ref 26.0–34.0)
MCHC: 31.2 g/dL (ref 30.0–36.0)
MCV: 83.6 fL (ref 80.0–100.0)
Platelets: 339 K/uL (ref 150–400)
RBC: 4.33 MIL/uL (ref 3.87–5.11)
RDW: 13.9 % (ref 11.5–15.5)
WBC: 10.2 K/uL (ref 4.0–10.5)
nRBC: 0 % (ref 0.0–0.2)

## 2023-12-14 LAB — TROPONIN I (HIGH SENSITIVITY): Troponin I (High Sensitivity): <2 ng/L

## 2023-12-14 NOTE — ED Triage Notes (Signed)
 Pt arrives POV with complaints of centralized chest tightness that started last night while she was sitting. Pain does not radiate. Denies other sx.

## 2023-12-15 ENCOUNTER — Encounter (HOSPITAL_COMMUNITY): Payer: Self-pay | Admitting: Emergency Medicine

## 2023-12-15 LAB — TROPONIN I (HIGH SENSITIVITY): Troponin I (High Sensitivity): <2 ng/L (ref <18)

## 2023-12-15 NOTE — ED Notes (Signed)
 Pt refusing IV for CT, MD notified.

## 2023-12-16 ENCOUNTER — Emergency Department (HOSPITAL_COMMUNITY)
Admission: EM | Admit: 2023-12-16 | Discharge: 2023-12-16 | Disposition: A | Discharge status: Home / Self Care | Payer: MEDICAID | Attending: Emergency Medicine | Admitting: Emergency Medicine

## 2023-12-16 ENCOUNTER — Other Ambulatory Visit: Payer: Self-pay

## 2023-12-16 ENCOUNTER — Emergency Department (EMERGENCY_DEPARTMENT_HOSPITAL): Payer: MEDICAID

## 2023-12-16 ENCOUNTER — Encounter (HOSPITAL_COMMUNITY): Payer: Self-pay | Admitting: *Deleted

## 2023-12-16 ENCOUNTER — Emergency Department (HOSPITAL_COMMUNITY): Payer: MEDICAID

## 2023-12-16 DIAGNOSIS — E119 Type 2 diabetes mellitus without complications: Secondary | ICD-10-CM | POA: Insufficient documentation

## 2023-12-16 DIAGNOSIS — M79604 Pain in right leg: Secondary | ICD-10-CM

## 2023-12-16 DIAGNOSIS — J069 Acute upper respiratory infection, unspecified: Secondary | ICD-10-CM | POA: Insufficient documentation

## 2023-12-16 DIAGNOSIS — R059 Cough, unspecified: Secondary | ICD-10-CM | POA: Diagnosis present

## 2023-12-16 DIAGNOSIS — D72829 Elevated white blood cell count, unspecified: Secondary | ICD-10-CM | POA: Diagnosis not present

## 2023-12-16 LAB — CBC
HCT: 39.6 % (ref 36.0–46.0)
Hemoglobin: 12.1 g/dL (ref 12.0–15.0)
MCH: 25.9 pg — ABNORMAL LOW (ref 26.0–34.0)
MCHC: 30.6 g/dL (ref 30.0–36.0)
MCV: 84.6 fL (ref 80.0–100.0)
Platelets: 309 K/uL (ref 150–400)
RBC: 4.68 MIL/uL (ref 3.87–5.11)
RDW: 13.6 % (ref 11.5–15.5)
WBC: 11.2 K/uL — ABNORMAL HIGH (ref 4.0–10.5)
nRBC: 0 % (ref 0.0–0.2)

## 2023-12-16 LAB — BASIC METABOLIC PANEL WITH GFR
Anion gap: 13 (ref 5–15)
BUN: 8 mg/dL (ref 6–20)
CO2: 21 mmol/L — ABNORMAL LOW (ref 22–32)
Calcium: 8.8 mg/dL — ABNORMAL LOW (ref 8.9–10.3)
Chloride: 105 mmol/L (ref 98–111)
Creatinine, Ser: 0.64 mg/dL (ref 0.44–1.00)
GFR, Estimated: >60 mL/min (ref >60)
Glucose, Bld: 127 mg/dL — ABNORMAL HIGH (ref 70–99)
Potassium: 3.5 mmol/L (ref 3.5–5.1)
Sodium: 139 mmol/L (ref 135–145)

## 2023-12-16 LAB — D-DIMER, QUANTITATIVE: D-Dimer, Quant: <0.27 ug{FEU}/mL (ref 0.00–0.50)

## 2023-12-16 LAB — RESP PANEL BY RT-PCR (RSV, FLU A&B, COVID)  RVPGX2
Influenza A by PCR: NEGATIVE
Influenza B by PCR: NEGATIVE
Resp Syncytial Virus by PCR: NEGATIVE
SARS Coronavirus 2 by RT PCR: NEGATIVE

## 2023-12-16 LAB — TROPONIN I (HIGH SENSITIVITY): Troponin I (High Sensitivity): <2 ng/L (ref <18)

## 2023-12-16 LAB — HCG, SERUM, QUALITATIVE: Preg, Serum: NEGATIVE

## 2023-12-16 MED ORDER — ACETAMINOPHEN 325 MG PO TABS
650.0000 mg | ORAL_TABLET | Freq: Once | ORAL | Status: AC
  Administered 2023-12-16: 650 mg via ORAL
  Filled 2023-12-16: qty 2

## 2023-12-16 NOTE — Discharge Instructions (Addendum)
 Please follow-up with your primary care provider next week.  Seek emergency care if experiencing any new or worsening symptoms.  Alternating between 650 mg Tylenol  and 400 mg Advil : The best way to alternate taking Acetaminophen  (example Tylenol ) and Ibuprofen  (example Advil /Motrin ) is to take them 3 hours apart. For example, if you take ibuprofen  at 6 am you can then take Tylenol  at 9 am. You can continue this regimen throughout the day, making sure you do not exceed the recommended maximum dose for each drug.

## 2023-12-16 NOTE — ED Provider Notes (Signed)
 [place] [facility]MENT AT [facility] Provider Note   CSN: [id number] Arrival date & time: 12/16/23  1139     Patient presents with: Chest Pain   Martha Lopez is a 33 y.o. female with PMHx DM, migraines who presents to ED concerned for constant chest pain and SOB x3 days. Pain starts in right upper chest and radiates throughout entire anterior chest. Symptoms are worse with exertion. Endorses dry cough, rhinnorhea, and congestion over the past 2 days. Endorses coughing up some blood one time yesterday which has since resolved. Endorses right calf pain that has been present for a couple of months.   Denies fever,  nausea, vomiting, diarrhea. Denies recent surgery/immobilization, hx DVT/PE.  Patient refuses IV.    Chest Pain      Prior to Admission medications   Medication Sig Start Date End Date Taking? Authorizing Provider  busPIRone  (BUSPAR ) 10 MG tablet Take 1 tablet (10 mg total) by mouth 3 (three) times daily. Patient not taking: Reported on 08/09/2023 01/25/22   Harl Zane BRAVO, NP  cyclobenzaprine  (FLEXERIL ) 10 MG tablet Take 1 tablet (10 mg total) by mouth 2 (two) times daily as needed for muscle spasms. 12/03/23   Robinson, John K, PA-C  escitalopram  (LEXAPRO ) 20 MG tablet Take 1 tablet (20 mg total) by mouth daily. Patient not taking: Reported on 08/09/2023 08/29/22   Parsons, Brittney E, NP  hydrocortisone  (ANUSOL -HC) 2.5 % rectal cream Place 1 application rectally 2 (two) times daily. Patient not taking: Reported on 08/09/2023 12/08/20   Jerrol Agent, MD  lidocaine  (LIDODERM ) 5 % Place 1 patch onto the skin daily. Remove & Discard patch within 12 hours or as directed by MD 12/03/23   Lang Norleen POUR, PA-C  risperiDONE  (RISPERDAL ) 1 MG tablet Take 1 tablet (1 mg total) by mouth 2 (two) times daily. Patient not taking: Reported on 08/09/2023 03/06/23   Cleotilde Rogue, MD  risperiDONE  (RISPERDAL ) 1 MG tablet Take 1 tablet (1 mg total) by mouth 2 (two)  times daily. Patient not taking: Reported on 08/09/2023 03/06/23   Mardy Legacy, NP  traZODone  (DESYREL ) 150 MG tablet Take 1 tablet (150 mg total) by mouth at bedtime as needed for sleep. Patient not taking: Reported on 08/09/2023 08/29/22   Harl Zane BRAVO, NP    Allergies: Patient has no known allergies.    Review of Systems  Cardiovascular:  Positive for chest pain.    Updated Vital Signs BP 101/71   Pulse 74   Temp 98.6 F (37 C)   Resp 16   Ht 5' 3 (1.6 m)   Wt 72.6 kg   LMP 10/29/2023 (Approximate)   SpO2 99%   BMI 28.34 kg/m   Physical Exam Vitals and nursing note reviewed.  Constitutional:      General: She is not in acute distress.    Appearance: She is not ill-appearing or toxic-appearing.  HENT:     Head: Normocephalic and atraumatic.     Mouth/Throat:     Mouth: Mucous membranes are moist.     Pharynx: No oropharyngeal exudate or posterior oropharyngeal erythema.  Eyes:     General: No scleral icterus.       Right eye: No discharge.        Left eye: No discharge.     Conjunctiva/sclera: Conjunctivae normal.  Cardiovascular:     Rate and Rhythm: Normal rate and regular rhythm.     Pulses: Normal pulses.     Heart sounds: Normal heart  sounds. No murmur heard. Pulmonary:     Effort: Pulmonary effort is normal. No respiratory distress.     Breath sounds: Normal breath sounds. No wheezing, rhonchi or rales.  Chest:     Comments: Anterior chest wall is tender to palpation. Abdominal:     Palpations: Abdomen is soft.  Musculoskeletal:     Right lower leg: No edema.     Left lower leg: No edema.     Comments: Right lateral calf is tender to palpation  Skin:    General: Skin is warm and dry.     Findings: No rash.  Neurological:     General: No focal deficit present.     Mental Status: She is alert and oriented to person, place, and time. Mental status is at baseline.  Psychiatric:        Mood and Affect: Mood normal.        Behavior: Behavior  normal.     (all labs ordered are listed, but only abnormal results are displayed) Labs Reviewed  BASIC METABOLIC PANEL WITH GFR - Abnormal; Notable for the following components:      Result Value   CO2 21 (*)    Glucose, Bld 127 (*)    Calcium 8.8 (*)    All other components within normal limits  CBC - Abnormal; Notable for the following components:   WBC 11.2 (*)    MCH 25.9 (*)    All other components within normal limits  RESP PANEL BY RT-PCR (RSV, FLU A&B, COVID)  RVPGX2  HCG, SERUM, QUALITATIVE  D-DIMER, QUANTITATIVE  TROPONIN I (HIGH SENSITIVITY)    EKG: None  Radiology: VAS US  LOWER EXTREMITY VENOUS (DVT) (7a-7p) Result Date: 12/16/2023  Lower Venous DVT Study Patient Name:  Martha Lopez  Date of Exam:   12/16/2023 Medical Rec #: [id number]        Accession #:    [id number] Date of Birth: March 19, 1990       Patient Gender: F Patient Age:   60 years Exam Location:  [facility] Procedure:      VAS US  LOWER EXTREMITY VENOUS (DVT) Referring Phys: [facility] --------------------------------------------------------------------------------  Indications: Right leg pain that is consistent with sciatica.  Comparison Study: No prior study on file Performing Technologist: Alberta Lis RVS  Examination Guidelines: A complete evaluation includes B-mode imaging, spectral Doppler, color Doppler, and power Doppler as needed of all accessible portions of each vessel. Bilateral testing is considered an integral part of a complete examination. Limited examinations for reoccurring indications may be performed as noted. The reflux portion of the exam is performed with the patient in reverse Trendelenburg.  +---------+---------------+---------+-----------+----------+--------------+ RIGHT    CompressibilityPhasicitySpontaneityPropertiesThrombus Aging +---------+---------------+---------+-----------+----------+--------------+ CFV      Full           Yes      Yes                                  +---------+---------------+---------+-----------+----------+--------------+ SFJ      Full                                                        +---------+---------------+---------+-----------+----------+--------------+ FV Prox  Full                                                        +---------+---------------+---------+-----------+----------+--------------+  FV Mid   Full                                                        +---------+---------------+---------+-----------+----------+--------------+ FV DistalFull                                                        +---------+---------------+---------+-----------+----------+--------------+ PFV      Full                                                        +---------+---------------+---------+-----------+----------+--------------+ POP      Full           Yes      Yes                                 +---------+---------------+---------+-----------+----------+--------------+ PTV      Full                                                        +---------+---------------+---------+-----------+----------+--------------+ PERO     Full                                                        +---------+---------------+---------+-----------+----------+--------------+ Gastroc  Full                                                        +---------+---------------+---------+-----------+----------+--------------+ SSV      Full                                                        +---------+---------------+---------+-----------+----------+--------------+   +----+---------------+---------+-----------+----------+--------------+ LEFTCompressibilityPhasicitySpontaneityPropertiesThrombus Aging +----+---------------+---------+-----------+----------+--------------+ CFV Full           Yes      Yes                                  +----+---------------+---------+-----------+----------+--------------+ SFJ Full           Yes      Yes                                 +----+---------------+---------+-----------+----------+--------------+  Summary: RIGHT: - There is no evidence of deep vein thrombosis in the lower extremity.  - No cystic structure found in the popliteal fossa.  LEFT: - No evidence of common femoral vein obstruction.   *See table(s) above for measurements and observations.    Preliminary    DG Chest 2 View Result Date: 12/16/2023 EXAM: 2 VIEW(S) XRAY OF THE CHEST 12/16/2023 01:34:00 PM COMPARISON: Comparison is made to the study dated 12/14/2023. CLINICAL HISTORY: chest pain FINDINGS: LUNGS AND PLEURA: No focal pulmonary opacity. No pleural effusion. No pneumothorax. HEART AND MEDIASTINUM: No acute abnormality of the cardiac and mediastinal silhouettes. BONES AND SOFT TISSUES: No acute osseous abnormality. IMPRESSION: 1. No acute cardiopulmonary disease. Electronically signed by: Lynwood Seip MD 12/16/2023 01:58 PM EST RP Workstation: [id number]   DG Chest 2 View Result Date: 12/14/2023 EXAM: 2 VIEW(S) XRAY OF THE CHEST 12/14/2023 11:05:23 PM COMPARISON: Comparison with 10/31/2021. CLINICAL HISTORY: chest pain FINDINGS: LUNGS AND PLEURA: No focal pulmonary opacity. No pleural effusion. No pneumothorax. HEART AND MEDIASTINUM: No acute abnormality of the cardiac and mediastinal silhouettes. BONES AND SOFT TISSUES: No acute osseous abnormality. IMPRESSION: 1. No acute cardiopulmonary process. Electronically signed by: Elsie Gravely MD 12/14/2023 11:11 PM EST RP Workstation: [id number]     Procedures   Medications Ordered in the ED  acetaminophen  (TYLENOL ) tablet 650 mg (650 mg Oral Given 12/16/23 1359)                                    Medical Decision Making Amount and/or Complexity of Data Reviewed Labs: ordered. Radiology: ordered.  Risk OTC drugs.   This patient presents to the ED for  concern of chest pain, this involves an extensive number of treatment options, and is a complaint that carries with it a high risk of complications and morbidity.  The differential diagnosis includes acute coronary syndrome, congestive heart failure, pericarditis, pneumonia, pulmonary embolism, tension pneumothorax, esophageal rupture, aortic dissection, cardiac tamponade, musculoskeletal   Co morbidities that complicate the patient evaluation  DM, migraines   Additional history obtained:  Patient with 4 other ED visits in the past 1 month   Problem List / ED Course / Critical interventions / Medication management  Patient presented for chest pain and SOB x3 days.  Patient also with recent rhinorrhea, congestion, cough.  Physical exam with mild right calf tenderness to palpation without any signs of swelling or erythema.  Patient also initially mildly tachycardic at 102.  Rest of physical exam reassuring.  Patient afebrile with stable vitals. I Ordered, and personally interpreted labs.  Troponin within normal limits.  CBC with mild leukocytosis 11.2.  No anemia.  D-dimer within normal limits.  hCG negative.  BMP with mild hyperglycemia 127.  CO2 is also mildly low at 21.  Respiratory panel negative.   The patient was maintained on a cardiac monitor.  I personally viewed and interpreted the EKG/cardiac monitored which showed an underlying rhythm of: sinus rhythm. I ordered imaging studies including chest xray and DVT US  to assess for process contributing to patient's symptoms. I independently visualized and interpreted imaging which showed no acute process. I agree with the radiologist interpretation. Shared all results with patient.  Answered all questions.  Overall, it appears the patient's symptoms are due to a viral URI given recent rhinorrhea, congestion, and cough.  Educated patient on alternating Advil  and Tylenol  for pain management.  Patient agreeable with plan. I have  reviewed the  patients home medicines and have made adjustments as needed The patient has been appropriately medically screened and/or stabilized in the ED. I have low suspicion for any other emergent medical condition which would require further screening, evaluation or treatment in the ED or require inpatient management. At time of discharge the patient is hemodynamically stable and in no acute distress. I have discussed work-up results and diagnosis with patient and answered all questions. Patient is agreeable with discharge plan. We discussed strict return precautions for returning to the emergency department and they verbalized understanding.    Social Determinants of Health:  none      Final diagnoses:  Viral URI with cough    ED Discharge Orders     None          Hoy Nidia FALCON, [place] 12/16/23 1458    Ellouise Richerd POUR, DO 12/16/23 1518

## 2023-12-16 NOTE — ED Triage Notes (Signed)
 Pt c/o chest pain and mild SOB x a while  Sts she was seen for the same a couple days ago, but was sent home w/o medication.  Sts she has been taking Tylenol  w/o relief.  NAD noted.

## 2023-12-16 NOTE — Progress Notes (Addendum)
 VASCULAR LAB    Right lower extremity venous duplex has been performed.  See CV proc for preliminary results.  Relayed results to Nidia Mays, PA-C via secure chat  RACHEL PELLET, RVT 12/16/2023, 1:18 PM

## 2023-12-17 ENCOUNTER — Encounter (HOSPITAL_COMMUNITY): Payer: Self-pay

## 2023-12-17 ENCOUNTER — Emergency Department (HOSPITAL_COMMUNITY)
Admission: EM | Admit: 2023-12-17 | Discharge: 2023-12-17 | Disposition: A | Discharge status: Home / Self Care | Payer: MEDICAID | Attending: Emergency Medicine | Admitting: Emergency Medicine

## 2023-12-17 ENCOUNTER — Other Ambulatory Visit: Payer: Self-pay

## 2023-12-17 DIAGNOSIS — R059 Cough, unspecified: Secondary | ICD-10-CM | POA: Diagnosis present

## 2023-12-17 DIAGNOSIS — J069 Acute upper respiratory infection, unspecified: Secondary | ICD-10-CM | POA: Diagnosis not present

## 2023-12-17 MED ORDER — BENZONATATE 100 MG PO CAPS: 200.0000 mg | ORAL_CAPSULE | Freq: Once | ORAL | Status: DC

## 2023-12-17 MED ORDER — BENZONATATE 100 MG PO CAPS
200.0000 mg | ORAL_CAPSULE | Freq: Three times a day (TID) | ORAL | 0 refills | Status: AC | PRN
  Filled 2023-12-17: qty 20, 4d supply, fill #0

## 2023-12-17 NOTE — ED Provider Notes (Signed)
 [place] EMERGENCY DEPARTMENT AT [facility] Provider Note   CSN: [id number] Arrival date & time: 12/17/23  9060     Patient presents with: URI   Martha Lopez is a 33 y.o. female.   Patient complains of a cough and congestion.  Patient reports that she was unable to sleep last night because she was coughing.  Patient was seen here yesterday and diagnosed with a viral illness.  Patient returned today because she feels so bad and could not sleep.  Patient reports that she has been taking ibuprofen .  She took Sudafed without relief of symptoms.  Patient denies any shortness of breath no nausea no vomiting no fever  The history is provided by the patient. No language interpreter was used.  URI      Prior to Admission medications   Medication Sig Start Date End Date Taking? Authorizing Provider  benzonatate (TESSALON) 100 MG capsule Take 2 capsules (200 mg total) by mouth 3 (three) times daily as needed for cough. 12/17/23  Yes Shanan Fitzpatrick K, PA-C  busPIRone  (BUSPAR ) 10 MG tablet Take 1 tablet (10 mg total) by mouth 3 (three) times daily. Patient not taking: Reported on 08/09/2023 01/25/22   Harl Zane BRAVO, NP  cyclobenzaprine  (FLEXERIL ) 10 MG tablet Take 1 tablet (10 mg total) by mouth 2 (two) times daily as needed for muscle spasms. 12/03/23   Robinson, John K, PA-C  escitalopram  (LEXAPRO ) 20 MG tablet Take 1 tablet (20 mg total) by mouth daily. Patient not taking: Reported on 08/09/2023 08/29/22   Parsons, Brittney E, NP  hydrocortisone  (ANUSOL -HC) 2.5 % rectal cream Place 1 application rectally 2 (two) times daily. Patient not taking: Reported on 08/09/2023 12/08/20   Jerrol Agent, MD  lidocaine  (LIDODERM ) 5 % Place 1 patch onto the skin daily. Remove & Discard patch within 12 hours or as directed by MD 12/03/23   Lang Norleen POUR, PA-C  risperiDONE  (RISPERDAL ) 1 MG tablet Take 1 tablet (1 mg total) by mouth 2 (two) times daily. Patient not taking: Reported on  08/09/2023 03/06/23   Cleotilde Rogue, MD  risperiDONE  (RISPERDAL ) 1 MG tablet Take 1 tablet (1 mg total) by mouth 2 (two) times daily. Patient not taking: Reported on 08/09/2023 03/06/23   Mardy Legacy, NP  traZODone  (DESYREL ) 150 MG tablet Take 1 tablet (150 mg total) by mouth at bedtime as needed for sleep. Patient not taking: Reported on 08/09/2023 08/29/22   Harl Zane BRAVO, NP    Allergies: Patient has no known allergies.    Review of Systems  All other systems reviewed and are negative.   Updated Vital Signs BP 126/71   Pulse 96   Temp 97.6 F (36.4 C)   Resp 14   LMP 10/29/2023 (Approximate)   SpO2 100%   Physical Exam Vitals and nursing note reviewed.  Constitutional:      Appearance: She is well-developed.  HENT:     Head: Normocephalic.  Cardiovascular:     Rate and Rhythm: Normal rate.  Pulmonary:     Effort: Pulmonary effort is normal.  Abdominal:     General: There is no distension.  Musculoskeletal:        General: Normal range of motion.     Cervical back: Normal range of motion.  Skin:    General: Skin is warm.  Neurological:     General: No focal deficit present.     Mental Status: She is alert and oriented to person, place, and time.     (  all labs ordered are listed, but only abnormal results are displayed) Labs Reviewed - No data to display  EKG: None  Radiology: VAS US  LOWER EXTREMITY VENOUS (DVT) (7a-7p) Result Date: 12/17/2023  Lower Venous DVT Study Patient Name:  Martha Lopez  Date of Exam:   12/16/2023 Medical Rec #: [id number]        Accession #:    [id number] Date of Birth: 1990/02/17       Patient Gender: F Patient Age:   75 years Exam Location:  [facility] Procedure:      VAS US  LOWER EXTREMITY VENOUS (DVT) Referring Phys: [facility] --------------------------------------------------------------------------------  Indications: Right leg pain that is consistent with sciatica.  Comparison Study: No prior study on file  Performing Technologist: Alberta Lis RVS  Examination Guidelines: A complete evaluation includes B-mode imaging, spectral Doppler, color Doppler, and power Doppler as needed of all accessible portions of each vessel. Bilateral testing is considered an integral part of a complete examination. Limited examinations for reoccurring indications may be performed as noted. The reflux portion of the exam is performed with the patient in reverse Trendelenburg.  +---------+---------------+---------+-----------+----------+--------------+ RIGHT    CompressibilityPhasicitySpontaneityPropertiesThrombus Aging +---------+---------------+---------+-----------+----------+--------------+ CFV      Full           Yes      Yes                                 +---------+---------------+---------+-----------+----------+--------------+ SFJ      Full                                                        +---------+---------------+---------+-----------+----------+--------------+ FV Prox  Full                                                        +---------+---------------+---------+-----------+----------+--------------+ FV Mid   Full                                                        +---------+---------------+---------+-----------+----------+--------------+ FV DistalFull                                                        +---------+---------------+---------+-----------+----------+--------------+ PFV      Full                                                        +---------+---------------+---------+-----------+----------+--------------+ POP      Full           Yes      Yes                                 +---------+---------------+---------+-----------+----------+--------------+  PTV      Full                                                        +---------+---------------+---------+-----------+----------+--------------+ PERO     Full                                                         +---------+---------------+---------+-----------+----------+--------------+ Gastroc  Full                                                        +---------+---------------+---------+-----------+----------+--------------+ SSV      Full                                                        +---------+---------------+---------+-----------+----------+--------------+   +----+---------------+---------+-----------+----------+--------------+ LEFTCompressibilityPhasicitySpontaneityPropertiesThrombus Aging +----+---------------+---------+-----------+----------+--------------+ CFV Full           Yes      Yes                                 +----+---------------+---------+-----------+----------+--------------+ SFJ Full           Yes      Yes                                 +----+---------------+---------+-----------+----------+--------------+     Summary: RIGHT: - There is no evidence of deep vein thrombosis in the lower extremity.  - No cystic structure found in the popliteal fossa.  LEFT: - No evidence of common femoral vein obstruction.   *See table(s) above for measurements and observations. Electronically signed by Norman Serve on 12/17/2023 at 9:00:15 AM.    Final    DG Chest 2 View Result Date: 12/16/2023 EXAM: 2 VIEW(S) XRAY OF THE CHEST 12/16/2023 01:34:00 PM COMPARISON: Comparison is made to the study dated 12/14/2023. CLINICAL HISTORY: chest pain FINDINGS: LUNGS AND PLEURA: No focal pulmonary opacity. No pleural effusion. No pneumothorax. HEART AND MEDIASTINUM: No acute abnormality of the cardiac and mediastinal silhouettes. BONES AND SOFT TISSUES: No acute osseous abnormality. IMPRESSION: 1. No acute cardiopulmonary disease. Electronically signed by: Lynwood Seip MD 12/16/2023 01:58 PM EST RP Workstation: [id number]     Procedures   Medications Ordered in the ED  benzonatate (TESSALON) capsule 200 mg (has no administration in time range)                                     Medical Decision Making Patient complains of a cough and nasal congestion.  Patient was seen here yesterday had an evaluation which included a negative D-dimer, negative influenza COVID RSV and a negative chest x-ray  Amount and/or Complexity of Data Reviewed External Data Reviewed: notes.    Details: Previous ED notes reviewed  Risk Prescription drug management. Risk Details: Patient is in the emergency department primarily due to a cough that kept her up all night.  I will give the patient a prescription for Tessalon Perles she is given a dosage here.  Patient is advised to go home and try to rest.  She is given a note for out of work for the next 2 days and discharged in stable condition        Final diagnoses:  Viral upper respiratory tract infection    ED Discharge Orders          Ordered    benzonatate (TESSALON) 100 MG capsule  3 times daily PRN        12/17/23 1020            An After Visit Summary was printed and given to the patient.    Flint Sonny POUR, PA-C 12/17/23 1023    Yolande Lamar BROCKS, MD 12/17/23 1728

## 2023-12-17 NOTE — ED Triage Notes (Signed)
 Pt c.o URI symptoms. Pt seen here twice this week for the same.

## 2023-12-18 ENCOUNTER — Other Ambulatory Visit: Payer: Self-pay

## 2023-12-21 ENCOUNTER — Emergency Department (HOSPITAL_COMMUNITY)
Admission: EM | Admit: 2023-12-21 | Discharge: 2023-12-21 | Disposition: A | Discharge status: Home / Self Care | Payer: MEDICAID

## 2023-12-21 ENCOUNTER — Encounter (HOSPITAL_COMMUNITY): Payer: Self-pay | Admitting: *Deleted

## 2023-12-21 ENCOUNTER — Other Ambulatory Visit: Payer: Self-pay

## 2023-12-21 DIAGNOSIS — M545 Low back pain, unspecified: Secondary | ICD-10-CM | POA: Diagnosis present

## 2023-12-21 DIAGNOSIS — M62830 Muscle spasm of back: Secondary | ICD-10-CM | POA: Insufficient documentation

## 2023-12-21 MED ORDER — KETOROLAC TROMETHAMINE 15 MG/ML IJ SOLN
15.0000 mg | Freq: Once | INTRAMUSCULAR | Status: AC
  Administered 2023-12-21: 15 mg via INTRAMUSCULAR
  Filled 2023-12-21: qty 1

## 2023-12-21 MED ORDER — METHOCARBAMOL 500 MG PO TABS
1000.0000 mg | ORAL_TABLET | Freq: Once | ORAL | Status: AC
  Administered 2023-12-21: 1000 mg via ORAL
  Filled 2023-12-21: qty 2

## 2023-12-21 MED ORDER — METHOCARBAMOL 500 MG PO TABS
500.0000 mg | ORAL_TABLET | Freq: Four times a day (QID) | ORAL | 0 refills | Status: AC | PRN
  Filled 2023-12-21: qty 28, 7d supply, fill #0

## 2023-12-21 NOTE — ED Notes (Signed)
Pt was given discharge instructions and verbalized understanding.

## 2023-12-21 NOTE — ED Provider Notes (Signed)
 [place] EMERGENCY DEPARTMENT AT [facility] Provider Note   CSN: [id number] Arrival date & time: 12/21/23  0013     Patient presents with: Back Pain   Martha Lopez is a 33 y.o. female.   33 year old female presents for evaluation of back pain. States its been going on for 4 days. She denies any falls or injuries. Denies any dysuria or any other symptoms or concerns.    Back Pain Associated symptoms: no abdominal pain, no chest pain, no dysuria and no fever        Prior to Admission medications   Medication Sig Start Date End Date Taking? Authorizing Provider  methocarbamol (ROBAXIN) 500 MG tablet Take 1 tablet (500 mg total) by mouth every 6 (six) hours as needed for up to 7 days for muscle spasms. 12/21/23 12/28/23 Yes Nawaal Alling L, DO  benzonatate (TESSALON) 100 MG capsule Take 2 capsules (200 mg total) by mouth 3 (three) times daily as needed for cough. 12/17/23   Sofia, Leslie K, PA-C  busPIRone  (BUSPAR ) 10 MG tablet Take 1 tablet (10 mg total) by mouth 3 (three) times daily. Patient not taking: Reported on 08/09/2023 01/25/22   Harl Zane BRAVO, NP  cyclobenzaprine  (FLEXERIL ) 10 MG tablet Take 1 tablet (10 mg total) by mouth 2 (two) times daily as needed for muscle spasms. 12/03/23   Robinson, John K, PA-C  escitalopram  (LEXAPRO ) 20 MG tablet Take 1 tablet (20 mg total) by mouth daily. Patient not taking: Reported on 08/09/2023 08/29/22   Parsons, Brittney E, NP  hydrocortisone  (ANUSOL -HC) 2.5 % rectal cream Place 1 application rectally 2 (two) times daily. Patient not taking: Reported on 08/09/2023 12/08/20   Jerrol Agent, MD  lidocaine  (LIDODERM ) 5 % Place 1 patch onto the skin daily. Remove & Discard patch within 12 hours or as directed by MD 12/03/23   Lang Norleen POUR, PA-C  risperiDONE  (RISPERDAL ) 1 MG tablet Take 1 tablet (1 mg total) by mouth 2 (two) times daily. Patient not taking: Reported on 08/09/2023 03/06/23   Cleotilde Rogue, MD  risperiDONE   (RISPERDAL ) 1 MG tablet Take 1 tablet (1 mg total) by mouth 2 (two) times daily. Patient not taking: Reported on 08/09/2023 03/06/23   Mardy Legacy, NP  traZODone  (DESYREL ) 150 MG tablet Take 1 tablet (150 mg total) by mouth at bedtime as needed for sleep. Patient not taking: Reported on 08/09/2023 08/29/22   Harl Zane BRAVO, NP    Allergies: Patient has no known allergies.    Review of Systems  Constitutional:  Negative for chills and fever.  HENT:  Negative for ear pain and sore throat.   Eyes:  Negative for pain and visual disturbance.  Respiratory:  Negative for cough and shortness of breath.   Cardiovascular:  Negative for chest pain and palpitations.  Gastrointestinal:  Negative for abdominal pain and vomiting.  Genitourinary:  Negative for dysuria and hematuria.  Musculoskeletal:  Positive for back pain. Negative for arthralgias.  Skin:  Negative for color change and rash.  Neurological:  Negative for seizures and syncope.  All other systems reviewed and are negative.   Updated Vital Signs BP 102/64 (BP Location: Right Arm)   Pulse 80   Temp 98.3 F (36.8 C) (Oral)   Resp 16   Ht 5' 3 (1.6 m)   Wt 72.6 kg   LMP 12/15/2023   SpO2 100%   BMI 28.35 kg/m   Physical Exam Vitals and nursing note reviewed.  Constitutional:  General: She is not in acute distress.    Appearance: Normal appearance. She is well-developed. She is not ill-appearing.  HENT:     Head: Normocephalic and atraumatic.  Eyes:     Conjunctiva/sclera: Conjunctivae normal.  Cardiovascular:     Rate and Rhythm: Normal rate and regular rhythm.     Heart sounds: No murmur heard. Pulmonary:     Effort: Pulmonary effort is normal. No respiratory distress.     Breath sounds: Normal breath sounds.  Abdominal:     Palpations: Abdomen is soft.     Tenderness: There is no abdominal tenderness.  Musculoskeletal:        General: Tenderness present. No swelling.     Cervical back: Neck supple.      Comments: Right lower back tenderness to palpation, hypertonicity to lower back musculature   Skin:    General: Skin is warm and dry.     Capillary Refill: Capillary refill takes less than 2 seconds.  Neurological:     General: No focal deficit present.     Mental Status: She is alert.  Psychiatric:        Mood and Affect: Mood normal.     (all labs ordered are listed, but only abnormal results are displayed) Labs Reviewed - No data to display  EKG: None  Radiology: No results found.   Procedures   Medications Ordered in the ED  ketorolac  (TORADOL ) 15 MG/ML injection 15 mg (has no administration in time range)  methocarbamol (ROBAXIN) tablet 1,000 mg (1,000 mg Oral Given 12/21/23 0919)                                    Medical Decision Making Patient here for musculoskeletal back pain.  Likely spasm.  Was given Robaxin and Toradol  here and I will give prescription for Robaxin.  Advised Tylenol  Motrin  as needed for pain.  No other concerning signs or symptoms, able to ambulate without difficulty and stable vitals.  Advise close up with primary care and otherwise return to the ER for new or worsening symptoms.  She feels comfortable to plan be discharged home.  Problems Addressed: Acute right-sided low back pain without sciatica: acute illness or injury Spasm of muscle of lower back: acute illness or injury  Amount and/or Complexity of Data Reviewed External Data Reviewed: notes.    Details: Prior ED records reviewed and patient has been here frequently in the last week for viral URI  Risk OTC drugs. Prescription drug management.     Final diagnoses:  Acute right-sided low back pain without sciatica  Spasm of muscle of lower back    ED Discharge Orders          Ordered    methocarbamol (ROBAXIN) 500 MG tablet  Every 6 hours PRN        12/21/23 0912               Ellesse Antenucci L, DO 12/21/23 [phone]

## 2023-12-21 NOTE — Discharge Instructions (Addendum)
 You can alternate tylenol  and motrin  as needed for pain. Take your robaxin as needed for pain. You can use heat as needed.

## 2023-12-21 NOTE — ED Triage Notes (Signed)
 POV/ ambulatory/ c/o lower back pain with right leg tingling/ hx of sciatica/ taking aleive with no relief

## 2023-12-27 ENCOUNTER — Other Ambulatory Visit: Payer: Self-pay

## 2024-01-01 ENCOUNTER — Other Ambulatory Visit: Payer: Self-pay

## 2024-01-01 ENCOUNTER — Emergency Department (HOSPITAL_COMMUNITY)
Admission: EM | Admit: 2024-01-01 | Discharge: 2024-01-01 | Discharge status: Against Medical Advice | Payer: MEDICAID | Attending: Emergency Medicine | Admitting: Emergency Medicine

## 2024-01-01 DIAGNOSIS — Z5321 Procedure and treatment not carried out due to patient leaving prior to being seen by health care provider: Secondary | ICD-10-CM | POA: Diagnosis not present

## 2024-01-01 DIAGNOSIS — M79605 Pain in left leg: Secondary | ICD-10-CM | POA: Diagnosis present

## 2024-01-01 NOTE — ED Triage Notes (Signed)
 Patient arrives via EMS for eval of LLE pain without injury since this morning. No OTC meds PTA. Patient yelling obscenities and derogatory names at staff on arrival to ED.

## 2024-01-09 ENCOUNTER — Encounter (HOSPITAL_COMMUNITY): Payer: Self-pay

## 2024-01-09 ENCOUNTER — Other Ambulatory Visit: Payer: Self-pay

## 2024-01-09 ENCOUNTER — Emergency Department (HOSPITAL_COMMUNITY): Payer: MEDICAID

## 2024-01-09 ENCOUNTER — Emergency Department (HOSPITAL_COMMUNITY)
Admission: EM | Admit: 2024-01-09 | Discharge: 2024-01-09 | Disposition: A | Discharge status: Home / Self Care | Payer: MEDICAID | Attending: Emergency Medicine | Admitting: Emergency Medicine

## 2024-01-09 DIAGNOSIS — M79671 Pain in right foot: Secondary | ICD-10-CM | POA: Insufficient documentation

## 2024-01-09 DIAGNOSIS — Z79899 Other long term (current) drug therapy: Secondary | ICD-10-CM | POA: Diagnosis not present

## 2024-01-09 DIAGNOSIS — M7989 Other specified soft tissue disorders: Secondary | ICD-10-CM | POA: Diagnosis not present

## 2024-01-09 DIAGNOSIS — R2242 Localized swelling, mass and lump, left lower limb: Secondary | ICD-10-CM | POA: Insufficient documentation

## 2024-01-09 DIAGNOSIS — M79672 Pain in left foot: Secondary | ICD-10-CM | POA: Diagnosis not present

## 2024-01-09 DIAGNOSIS — R2241 Localized swelling, mass and lump, right lower limb: Secondary | ICD-10-CM | POA: Insufficient documentation

## 2024-01-09 DIAGNOSIS — E119 Type 2 diabetes mellitus without complications: Secondary | ICD-10-CM | POA: Insufficient documentation

## 2024-01-09 LAB — CBG MONITORING, ED: Glucose-Capillary: 85 mg/dL (ref 70–99)

## 2024-01-09 MED ORDER — KETOROLAC TROMETHAMINE 15 MG/ML IJ SOLN
15.0000 mg | Freq: Once | INTRAMUSCULAR | Status: AC
  Administered 2024-01-09: 15 mg via INTRAMUSCULAR
  Filled 2024-01-09: qty 1

## 2024-01-09 NOTE — Discharge Instructions (Signed)
 It was a pleasure taking care of you today. You were seen in the Emergency Department for evaluation of bilateral foot pain. Your work-up was reassuring. Your x-ray does show some swelling, however there is no evidence of fracture or dislocation.  I did treat your pain with an anti-inflammatory.  You may continue to take anti-inflammatory such as ibuprofen  or Advil  every 8 hours as needed for pain and swelling. Refer to the attached documentation for further management of your symptoms. Follow up with your PCP for further evaluation if your symptoms continue.  Please return to the ER if you experience chest pain, trouble breathing, intractable nausea/vomiting or any other life threatening illnesses.

## 2024-01-09 NOTE — ED Notes (Signed)
 Cbg 85

## 2024-01-09 NOTE — ED Notes (Signed)
 Patient given bag meal upon request tolerating upright position without changes in wellbeing eating.  CBG 85 prior to intake.

## 2024-01-09 NOTE — ED Provider Notes (Signed)
 " [place] EMERGENCY DEPARTMENT AT [facility] Provider Note   CSN: [id number] Arrival date & time: 01/09/24  9985     Patient presents with: Foot Pain   Martha Lopez is a 33 y.o. female with past medical history of diabetes, depression, PTSD, bipolar 2, who presents emergency department for evaluation of bilateral foot pain.  Patient reports that she has had worsening bilateral foot pain for the last couple of days, now making it difficult to walk.  She describes a pulsating sensation in her feet, primarily at the tips of her toes.  She also reports increased swelling to her bilateral feet.  Patient states she has been on her feet more than normal and has been walking around a lot.  Patient states she does not check her blood sugars every day.  Patient denies history of prior gout, but states that her father does have gout.  Patient reports that she has had difficulty keeping her toes warm.  She states the pain has made it difficult for her to sleep through the night.   Foot Pain       Prior to Admission medications  Medication Sig Start Date End Date Taking? Authorizing Provider  benzonatate  (TESSALON ) 100 MG capsule Take 2 capsules (200 mg total) by mouth 3 (three) times daily as needed for cough. 12/17/23   Sofia, Leslie K, PA-C  busPIRone  (BUSPAR ) 10 MG tablet Take 1 tablet (10 mg total) by mouth 3 (three) times daily. Patient not taking: Reported on 08/09/2023 01/25/22   Harl Zane BRAVO, NP  cyclobenzaprine  (FLEXERIL ) 10 MG tablet Take 1 tablet (10 mg total) by mouth 2 (two) times daily as needed for muscle spasms. 12/03/23   Robinson, John K, PA-C  escitalopram  (LEXAPRO ) 20 MG tablet Take 1 tablet (20 mg total) by mouth daily. Patient not taking: Reported on 08/09/2023 08/29/22   Parsons, Brittney E, NP  hydrocortisone  (ANUSOL -HC) 2.5 % rectal cream Place 1 application rectally 2 (two) times daily. Patient not taking: Reported on 08/09/2023 12/08/20   Jerrol Agent, MD   lidocaine  (LIDODERM ) 5 % Place 1 patch onto the skin daily. Remove & Discard patch within 12 hours or as directed by MD 12/03/23   Robinson, John K, PA-C  risperiDONE  (RISPERDAL ) 1 MG tablet Take 1 tablet (1 mg total) by mouth 2 (two) times daily. Patient not taking: Reported on 08/09/2023 03/06/23   Cleotilde Rogue, MD  risperiDONE  (RISPERDAL ) 1 MG tablet Take 1 tablet (1 mg total) by mouth 2 (two) times daily. Patient not taking: Reported on 08/09/2023 03/06/23   Mardy Legacy, NP  traZODone  (DESYREL ) 150 MG tablet Take 1 tablet (150 mg total) by mouth at bedtime as needed for sleep. Patient not taking: Reported on 08/09/2023 08/29/22   Harl Zane BRAVO, NP    Allergies: Patient has no known allergies.    Review of Systems  Musculoskeletal:  Positive for arthralgias.    Updated Vital Signs BP 137/88 (BP Location: Left Arm)   Pulse 91   Temp 98.2 F (36.8 C)   Resp 16   Ht 5' 3 (1.6 m)   Wt 72.6 kg   LMP 12/15/2023   SpO2 99%   BMI 28.34 kg/m   Physical Exam Vitals and nursing note reviewed.  Constitutional:      Appearance: Normal appearance. She is not ill-appearing.  Eyes:     General: No scleral icterus. Pulmonary:     Effort: Pulmonary effort is normal. No respiratory distress.  Musculoskeletal:  General: Swelling present. No deformity.     Comments: Patient's bilateral dorsal aspect of her feet are swollen.  DP pulses are palpable bilaterally.  Patient's sensation is 5 out of 5 bilaterally.  She is able to plantar and dorsiflex, although this is weakened, secondary to pain  Skin:    Coloration: Skin is not jaundiced.  Neurological:     General: No focal deficit present.     Mental Status: She is alert.  Psychiatric:        Mood and Affect: Mood normal.     (all labs ordered are listed, but only abnormal results are displayed) Labs Reviewed  CBG MONITORING, ED    EKG: None  Radiology: DG Foot Complete Left Result Date: 01/09/2024 EXAM: 3 or  more VIEW(S) XRAY OF THE LEFT FOOT 01/09/2024 08:26:00 AM COMPARISON: Left foot radiographs 02/26/2012. CLINICAL HISTORY: 33 year old female with foot pain and swelling. FINDINGS: BONES AND JOINTS: Progressed degenerative spurring at the anterior calcaneus. Normal background bone mineralization. No acute osseous abnormality. No malalignment. SOFT TISSUES: Soft tissue swelling of the forefoot. IMPRESSION: 1. Forefoot soft tissue swelling. No acute osseous abnormality identified about the left foot. Electronically signed by: Helayne Hurst MD 01/09/2024 09:13 AM EST RP Workstation: [id number]   DG Foot Complete Right Result Date: 01/09/2024 EXAM: 3 OR MORE VIEW(S) XRAY OF THE FOOT 01/09/2024 08:26:00 AM COMPARISON: None available. CLINICAL HISTORY: 33 year old female with foot pain and swelling. FINDINGS: BONES AND JOINTS: Small calcaneal spur. Normal bone mineralization. No acute fracture. No malalignment. SOFT TISSUES: Soft tissue swelling about the forefoot. IMPRESSION: 1. soft tissue swelling. No acute osseous abnormality identified about the right foot. Electronically signed by: Helayne Hurst MD 01/09/2024 09:12 AM EST RP Workstation: [id number]    Procedures   Medications Ordered in the ED  ketorolac  (TORADOL ) 15 MG/ML injection 15 mg (has no administration in time range)                                 Medical Decision Making Amount and/or Complexity of Data Reviewed Radiology: ordered.  Risk Prescription drug management.   This patient presents to the ED for concern of bilateral foot pain, this involves an extensive number of treatment options, and is a complaint that carries with it a high risk of complications and morbidity.   Differential diagnosis includes: Gout, diabetic neuropathy, cold weather injury, cellulitis, ischemic limb, fracture, dislocation  Co morbidities:  diabetes, bipolar type II  Social Determinants of Health:   patient frequently evaluated in the emergency  department for similar type complaints  Additional history:  Patient most recently evaluated in the emergency department on 12/4 for evaluation of back pain.  No red flag symptoms identified at that time.  Lab Tests:  I Ordered, and personally interpreted labs.  The pertinent results include: No acute abnormalities  Imaging Studies:  I ordered imaging studies including left and right foot x-ray I independently visualized and interpreted imaging which showed soft tissue swelling without evidence of fracture or dislocation I agree with the radiologist interpretation  Cardiac Monitoring/ECG:  The patient was maintained on a cardiac monitor.  I personally viewed and interpreted the cardiac monitored which showed an underlying rhythm of: Sinus rhythm  Medicines ordered and prescription drug management:  I ordered medication including  Medications  ketorolac  (TORADOL ) 15 MG/ML injection 15 mg (has no administration in time range)   for pain management Reevaluation of the patient after these  medicines showed that the patient improved I have reviewed the patients home medicines and have made adjustments as needed  Test Considered:   none  Critical Interventions:   none  Consultations Obtained: None  Problem List / ED Course:     ICD-10-CM   1. Localized swelling of left foot  R22.42     2. Localized swelling of right foot  R22.41       MDM: 33 year old female who presents emergency department for evaluation of bilateral foot pain.  Patient does have a history of diabetes, so it is possible this may be diabetic neuropathy.  However, patient's feet are swollen bilaterally, so x-rays have been obtained to rule out any other etiology, including gout in her great toes.  Pulses are palpable bilaterally, so unlikely to be ischemic.  Patient's skin is not erythematous and there are no ulcers noted, so less likely cellulitis.  Patient's x-rays are reassuring.  There is no evidence  of fracture or dislocation.  Patient symptoms are nontraumatic.  Recommendation for patient to follow-up with her PCP for further evaluation.  I also recommended patient continue taking anti-inflammatories as needed for pain management.  Patient verbalized understanding to this.  Patient's vital signs are stable.  Patient is appropriate for discharge at this time.   Dispostion:  After consideration of the diagnostic results and the patients response to treatment, I feel that the patient would benefit from PCP follow-up and supportive care.      Final diagnoses:  Localized swelling of left foot  Localized swelling of right foot    ED Discharge Orders     None          Martha Lopez 01/09/24 0930    Levander Houston, MD 01/15/24 1232  "

## 2024-01-09 NOTE — ED Notes (Signed)
 Patient transported to X-ray

## 2024-01-09 NOTE — ED Triage Notes (Signed)
 Pt c/o bilateral foot pain since last week.

## 2024-01-09 NOTE — ED Notes (Signed)
 Patient reports 10/10 sharp stabbing pain with initial contact at hallway 15. Patient reports onset approximately one week ago without experiencing prior symptoms. Patient denied daily meds or known history of kidney or cardiovascular complications. Patient denied taking any unusual OTC medication with onset. Denied shortness of breath with onset or over the past week. Resting in bed with provider at bedside.

## 2024-01-20 ENCOUNTER — Emergency Department (HOSPITAL_COMMUNITY)
Admission: EM | Admit: 2024-01-20 | Discharge: 2024-01-21 | Disposition: A | Payer: MEDICAID | Attending: Emergency Medicine | Admitting: Emergency Medicine

## 2024-01-20 ENCOUNTER — Other Ambulatory Visit: Payer: Self-pay

## 2024-01-20 ENCOUNTER — Emergency Department (HOSPITAL_COMMUNITY): Payer: MEDICAID

## 2024-01-20 ENCOUNTER — Encounter (HOSPITAL_COMMUNITY): Payer: Self-pay | Admitting: *Deleted

## 2024-01-20 DIAGNOSIS — R110 Nausea: Secondary | ICD-10-CM | POA: Insufficient documentation

## 2024-01-20 DIAGNOSIS — M79621 Pain in right upper arm: Secondary | ICD-10-CM | POA: Insufficient documentation

## 2024-01-20 DIAGNOSIS — R079 Chest pain, unspecified: Secondary | ICD-10-CM | POA: Diagnosis present

## 2024-01-20 LAB — CBC
HCT: 39.1 % (ref 36.0–46.0)
Hemoglobin: 11.8 g/dL — ABNORMAL LOW (ref 12.0–15.0)
MCH: 26.3 pg (ref 26.0–34.0)
MCHC: 30.2 g/dL (ref 30.0–36.0)
MCV: 87.1 fL (ref 80.0–100.0)
Platelets: 351 K/uL (ref 150–400)
RBC: 4.49 MIL/uL (ref 3.87–5.11)
RDW: 13.9 % (ref 11.5–15.5)
WBC: 8.5 K/uL (ref 4.0–10.5)
nRBC: 0 % (ref 0.0–0.2)

## 2024-01-20 LAB — BASIC METABOLIC PANEL WITH GFR
Anion gap: 11 (ref 5–15)
BUN: 8 mg/dL (ref 6–20)
CO2: 24 mmol/L (ref 22–32)
Calcium: 9.1 mg/dL (ref 8.9–10.3)
Chloride: 103 mmol/L (ref 98–111)
Creatinine, Ser: 0.86 mg/dL (ref 0.44–1.00)
GFR, Estimated: >60 mL/min
Glucose, Bld: 96 mg/dL (ref 70–99)
Potassium: 4 mmol/L (ref 3.5–5.1)
Sodium: 138 mmol/L (ref 135–145)

## 2024-01-20 LAB — HCG, SERUM, QUALITATIVE: Preg, Serum: NEGATIVE

## 2024-01-20 LAB — TROPONIN T, HIGH SENSITIVITY: Troponin T High Sensitivity: <15 ng/L (ref 0–19)

## 2024-01-20 NOTE — ED Triage Notes (Signed)
 Chest  pain  for 3 days  now with nausea no vomiting  lmp dec 27th

## 2024-01-21 LAB — TROPONIN T, HIGH SENSITIVITY: Troponin T High Sensitivity: <15 ng/L (ref 0–19)

## 2024-01-21 MED ORDER — OMEPRAZOLE 20 MG PO CPDR: 20.0000 mg | DELAYED_RELEASE_CAPSULE | Freq: Every day | ORAL | 1 refills | Status: AC

## 2024-01-21 NOTE — ED Provider Notes (Signed)
 " [place] [facility] Provider Note   CSN: [id number] Arrival date & time: 01/20/24  2141     Patient presents with: Chest Pain   Martha Lopez is a 34 y.o. female who presents to the ED today for a 3-day history of substernal chest discomfort.  She likens the pain to a burning sensation that radiates up the chest.  Also states that she has some distal right upper extremity discomfort that is intermittent.  She denies any aggravating factors to her pain, does not have any change in her pain with eating though has had some intermittent nausea without any vomiting.  She does endorse tolerating oral solid and liquid oral intake though has had a reduced appetite over the last day or 2.  Review of her previous medical history does show previous diagnoses of schizoaffective disorder, bipolar 2 disorder, and GAD.    Chest Pain Associated symptoms: nausea   Associated symptoms: no vomiting        Prior to Admission medications  Medication Sig Start Date End Date Taking? Authorizing Provider  omeprazole  (PRILOSEC) 20 MG capsule Take 1 capsule (20 mg total) by mouth daily. 01/21/24  Yes Myriam Dorn BROCKS, PA  benzonatate  (TESSALON ) 100 MG capsule Take 2 capsules (200 mg total) by mouth 3 (three) times daily as needed for cough. 12/17/23   Sofia, Leslie K, PA-C  busPIRone  (BUSPAR ) 10 MG tablet Take 1 tablet (10 mg total) by mouth 3 (three) times daily. Patient not taking: Reported on 08/09/2023 01/25/22   Harl Zane BRAVO, NP  cyclobenzaprine  (FLEXERIL ) 10 MG tablet Take 1 tablet (10 mg total) by mouth 2 (two) times daily as needed for muscle spasms. 12/03/23   Robinson, John K, PA-C  escitalopram  (LEXAPRO ) 20 MG tablet Take 1 tablet (20 mg total) by mouth daily. Patient not taking: Reported on 08/09/2023 08/29/22   Parsons, Brittney E, NP  hydrocortisone  (ANUSOL -HC) 2.5 % rectal cream Place 1 application rectally 2 (two) times daily. Patient not taking: Reported  on 08/09/2023 12/08/20   Jerrol Agent, MD  lidocaine  (LIDODERM ) 5 % Place 1 patch onto the skin daily. Remove & Discard patch within 12 hours or as directed by MD 12/03/23   Lang Norleen POUR, PA-C  risperiDONE  (RISPERDAL ) 1 MG tablet Take 1 tablet (1 mg total) by mouth 2 (two) times daily. Patient not taking: Reported on 08/09/2023 03/06/23   Cleotilde Rogue, MD  risperiDONE  (RISPERDAL ) 1 MG tablet Take 1 tablet (1 mg total) by mouth 2 (two) times daily. Patient not taking: Reported on 08/09/2023 03/06/23   Mardy Legacy, NP  traZODone  (DESYREL ) 150 MG tablet Take 1 tablet (150 mg total) by mouth at bedtime as needed for sleep. Patient not taking: Reported on 08/09/2023 08/29/22   Harl Zane BRAVO, NP    Allergies: Patient has no known allergies.    Review of Systems  Cardiovascular:  Positive for chest pain.  Gastrointestinal:  Positive for nausea. Negative for vomiting.  All other systems reviewed and are negative.   Updated Vital Signs BP 136/88 (BP Location: Right Arm)   Pulse 96   Temp 99.3 F (37.4 C) (Oral)   Resp 18   Ht 5' 3 (1.6 m)   Wt 72.6 kg   LMP 01/13/2024   SpO2 100%   BMI 28.35 kg/m   Physical Exam Vitals and nursing note reviewed.  Constitutional:      General: She is awake. She is not in acute distress.  Appearance: Normal appearance. She is well-developed, well-groomed and normal weight.  HENT:     Head: Normocephalic and atraumatic.     Mouth/Throat:     Mouth: Mucous membranes are moist.     Pharynx: Oropharynx is clear.  Eyes:     Extraocular Movements: Extraocular movements intact.     Conjunctiva/sclera: Conjunctivae normal.     Pupils: Pupils are equal, round, and reactive to light.  Cardiovascular:     Rate and Rhythm: Normal rate and regular rhythm.     Pulses: Normal pulses.     Heart sounds: Normal heart sounds, S1 normal and S2 normal. No murmur heard.    No friction rub. No gallop.  Pulmonary:     Effort: Pulmonary effort is normal.      Breath sounds: Normal breath sounds and air entry.  Abdominal:     General: Abdomen is flat. Bowel sounds are normal.     Palpations: Abdomen is soft.     Tenderness: There is abdominal tenderness in the epigastric area. There is no guarding or rebound.     Comments: Mild epigastric tenderness appreciated.  Musculoskeletal:        General: Normal range of motion.     Cervical back: Normal range of motion and neck supple.     Right lower leg: No edema.     Left lower leg: No edema.  Lymphadenopathy:     Cervical: No cervical adenopathy.  Skin:    General: Skin is warm and dry.     Capillary Refill: Capillary refill takes less than 2 seconds.  Neurological:     General: No focal deficit present.     Mental Status: She is alert and oriented to person, place, and time. Mental status is at baseline.     GCS: GCS eye subscore is 4. GCS verbal subscore is 5. GCS motor subscore is 6.  Psychiatric:        Mood and Affect: Mood normal.        Behavior: Behavior is cooperative.     (all labs ordered are listed, but only abnormal results are displayed) Labs Reviewed  CBC - Abnormal; Notable for the following components:      Result Value   Hemoglobin 11.8 (*)    All other components within normal limits  BASIC METABOLIC PANEL WITH GFR  HCG, SERUM, QUALITATIVE  TROPONIN T, HIGH SENSITIVITY  TROPONIN T, HIGH SENSITIVITY    EKG: None  Radiology: DG Chest 2 View Result Date: 01/20/2024 EXAM: 2 VIEW(S) XRAY OF THE CHEST 01/20/2024 10:23:24 PM COMPARISON: 12/16/2023 CLINICAL HISTORY: Chest pain for 3 days. FINDINGS: LUNGS AND PLEURA: Azygos lobe. Pulmonary vascularity are normal. No pleural effusion. No pneumothorax. HEART AND MEDIASTINUM: Heart size is normal. Mediastinal contours appear intact. BONES AND SOFT TISSUES: No acute osseous abnormality. IMPRESSION: 1. No acute cardiopulmonary abnormality. Electronically signed by: Elsie Gravely MD 01/20/2024 10:28 PM EST RP Workstation:  [id number]     Procedures   Medications Ordered in the ED - No data to display                              Geneva (Revised) Score: 3, Geneva Score Interpretation: Low Risk Group: 7-9% incidence of pulmonary embolism from several studies PERC Score: 0, PERC Score Interpretation: No need for further workup, as <2% chance of PE.  If no criteria are positive and clinicians pre-test probability is <15%, PERC Rule criteria are satisfied  Medical Decision Making Risk Prescription drug management.   Medical Decision Making:   Martha Lopez is a 34 y.o. female who presented to the ED today with chest discomfort detailed above.     Complete initial physical exam performed, notably the patient  was alert and oriented in no apparent distress.  Physical exam is largely unremarkable with normal cardiac and pulmonary auscultation and mild tenderness to the epigastrium is appreciated.  No guarding or rebound.    Reviewed and confirmed nursing documentation for past medical history, family history, social history.    Initial Assessment:   With the patient's presentation of chest discomfort, consider possible ACS/STEMI.  Also consider pulmonary causes of chest pain such as pneumonia, pneumothorax, pulmonary embolus.  She does not have any dyspnea and has no risk factors for PE, negative Wells score.   Initial Plan:  Obtain hCGs qualitative to evaluate for pregnancy Screening labs including CBC and Metabolic panel to evaluate for infectious or metabolic etiology of disease.  CXR to evaluate for structural/infectious intrathoracic pathology.  EKG and serial troponin to evaluate for cardiac pathology. Objective evaluation as below reviewed   Initial Study Results:   Laboratory  All laboratory results reviewed without evidence of clinically relevant pathology.   Exceptions include: None  EKG EKG was reviewed independently. Rate, rhythm, axis, intervals all examined and without medically relevant  abnormality. ST segments without concerns for elevations.    Radiology:  All images reviewed independently. Agree with radiology report at this time.   DG Chest 2 View Result Date: 01/20/2024 EXAM: 2 VIEW(S) XRAY OF THE CHEST 01/20/2024 10:23:24 PM COMPARISON: 12/16/2023 CLINICAL HISTORY: Chest pain for 3 days. FINDINGS: LUNGS AND PLEURA: Azygos lobe. Pulmonary vascularity are normal. No pleural effusion. No pneumothorax. HEART AND MEDIASTINUM: Heart size is normal. Mediastinal contours appear intact. BONES AND SOFT TISSUES: No acute osseous abnormality. IMPRESSION: 1. No acute cardiopulmonary abnormality. Electronically signed by: Elsie Gravely MD 01/20/2024 10:28 PM EST RP Workstation: [id number]   DG Foot Complete Left Result Date: 01/09/2024 EXAM: 3 or more VIEW(S) XRAY OF THE LEFT FOOT 01/09/2024 08:26:00 AM COMPARISON: Left foot radiographs 02/26/2012. CLINICAL HISTORY: 34 year old female with foot pain and swelling. FINDINGS: BONES AND JOINTS: Progressed degenerative spurring at the anterior calcaneus. Normal background bone mineralization. No acute osseous abnormality. No malalignment. SOFT TISSUES: Soft tissue swelling of the forefoot. IMPRESSION: 1. Forefoot soft tissue swelling. No acute osseous abnormality identified about the left foot. Electronically signed by: Helayne Hurst MD 01/09/2024 09:13 AM EST RP Workstation: [id number]   DG Foot Complete Right Result Date: 01/09/2024 EXAM: 3 OR MORE VIEW(S) XRAY OF THE FOOT 01/09/2024 08:26:00 AM COMPARISON: None available. CLINICAL HISTORY: 34 year old female with foot pain and swelling. FINDINGS: BONES AND JOINTS: Small calcaneal spur. Normal bone mineralization. No acute fracture. No malalignment. SOFT TISSUES: Soft tissue swelling about the forefoot. IMPRESSION: 1. soft tissue swelling. No acute osseous abnormality identified about the right foot. Electronically signed by: Helayne Hurst MD 01/09/2024 09:12 AM EST RP Workstation: [id number]     Reassessment and Plan:   Based on the evaluation of the objective data obtained, chest x-ray is unremarkable as is the EKG which shows a normal sinus rhythm.  Troponin is less than 15 and with no change over a 2-hour interval.  There are no lab abnormalities in the BMP or CBC.  Physical exam showed some mild epigastric tenderness, otherwise no abnormal findings.  Given this chest discomfort may be due to either peptic ulcer disease or a esophageal  reflux, and as such we will manage with PPI, specifically omeprazole .  Discussed with patient need for following with primary care she has not had a physical within the last several years, recommend she follow-up with primary care for general health maintenance as well as for reevaluation of her chest discomfort.  Careful return precautions were given to the patient regarding worsening of symptoms or development of new symptoms prior to evaluation by primary care.  She voices understanding and agreement has no further concerns at this time thus will be discharged with outpatient follow-up.       Final diagnoses:  Nonspecific chest pain    ED Discharge Orders          Ordered    omeprazole  (PRILOSEC) 20 MG capsule  Daily        01/21/24 0656               Myriam Dorn BROCKS, PA 01/21/24 0706    Neysa Caron PARAS, DO 01/21/24 1045  "

## 2024-01-21 NOTE — ED Notes (Signed)
 Pt states that her right arm will become numb down to her fingertips for about 30 minutes at a time before resolving and coming back again later on. Says it is not painful, but is more irritating.

## 2024-01-21 NOTE — Discharge Instructions (Addendum)
 As discussed, if this continues to worsen, or care to the emergency department for reevaluation otherwise take the prescribed omeprazole , and for your convenience and dietary changes may help with your discomfort have been included.  Schedule an appointment with your primary care provider within the next 2 weeks for reassessment and for general health maintenance.

## 2024-02-20 ENCOUNTER — Emergency Department (HOSPITAL_COMMUNITY): Payer: MEDICAID

## 2024-02-20 ENCOUNTER — Other Ambulatory Visit: Payer: Self-pay

## 2024-02-20 ENCOUNTER — Emergency Department (HOSPITAL_COMMUNITY)
Admission: EM | Admit: 2024-02-20 | Discharge: 2024-02-21 | Payer: MEDICAID | Attending: Emergency Medicine | Admitting: Emergency Medicine

## 2024-02-20 DIAGNOSIS — Z5321 Procedure and treatment not carried out due to patient leaving prior to being seen by health care provider: Secondary | ICD-10-CM | POA: Insufficient documentation

## 2024-02-20 DIAGNOSIS — R079 Chest pain, unspecified: Secondary | ICD-10-CM | POA: Insufficient documentation

## 2024-02-20 DIAGNOSIS — E119 Type 2 diabetes mellitus without complications: Secondary | ICD-10-CM | POA: Insufficient documentation

## 2024-02-20 DIAGNOSIS — R197 Diarrhea, unspecified: Secondary | ICD-10-CM | POA: Insufficient documentation

## 2024-02-20 DIAGNOSIS — K921 Melena: Secondary | ICD-10-CM | POA: Insufficient documentation

## 2024-02-20 DIAGNOSIS — R0602 Shortness of breath: Secondary | ICD-10-CM | POA: Insufficient documentation

## 2024-02-20 LAB — BASIC METABOLIC PANEL WITH GFR
Anion gap: 10 (ref 5–15)
BUN: 11 mg/dL (ref 6–20)
CO2: 26 mmol/L (ref 22–32)
Calcium: 9.3 mg/dL (ref 8.9–10.3)
Chloride: 105 mmol/L (ref 98–111)
Creatinine, Ser: 0.73 mg/dL (ref 0.44–1.00)
GFR, Estimated: >60 mL/min
Glucose, Bld: 118 mg/dL — ABNORMAL HIGH (ref 70–99)
Potassium: 4.3 mmol/L (ref 3.5–5.1)
Sodium: 140 mmol/L (ref 135–145)

## 2024-02-20 LAB — TROPONIN T, HIGH SENSITIVITY
Troponin T High Sensitivity: 6 ng/L (ref 0–19)
Troponin T High Sensitivity: 7 ng/L (ref 0–19)

## 2024-02-20 LAB — CBC
HCT: 39.7 % (ref 36.0–46.0)
Hemoglobin: 12.5 g/dL (ref 12.0–15.0)
MCH: 26.4 pg (ref 26.0–34.0)
MCHC: 31.5 g/dL (ref 30.0–36.0)
MCV: 83.9 fL (ref 80.0–100.0)
Platelets: 358 10*3/uL (ref 150–400)
RBC: 4.73 MIL/uL (ref 3.87–5.11)
RDW: 13.6 % (ref 11.5–15.5)
WBC: 8 10*3/uL (ref 4.0–10.5)
nRBC: 0 % (ref 0.0–0.2)

## 2024-02-20 LAB — HCG, SERUM, QUALITATIVE: Preg, Serum: NEGATIVE

## 2024-02-20 NOTE — ED Triage Notes (Addendum)
 Pt presents via POV c/o SOB x3 day. Also report diarrhea. Reports had bloody in stool PTA. Reports small amount of blood. Also reports chest pain. Reports hx gout and DM.

## 2024-02-21 ENCOUNTER — Encounter (HOSPITAL_COMMUNITY): Payer: Self-pay | Admitting: *Deleted

## 2024-02-21 ENCOUNTER — Emergency Department (HOSPITAL_COMMUNITY): Payer: MEDICAID

## 2024-02-21 ENCOUNTER — Emergency Department (HOSPITAL_COMMUNITY)
Admission: EM | Admit: 2024-02-21 | Discharge: 2024-02-22 | Payer: MEDICAID | Attending: Emergency Medicine | Admitting: Emergency Medicine

## 2024-02-21 ENCOUNTER — Other Ambulatory Visit: Payer: Self-pay

## 2024-02-21 DIAGNOSIS — R110 Nausea: Secondary | ICD-10-CM | POA: Insufficient documentation

## 2024-02-21 DIAGNOSIS — R197 Diarrhea, unspecified: Secondary | ICD-10-CM | POA: Insufficient documentation

## 2024-02-21 DIAGNOSIS — R079 Chest pain, unspecified: Secondary | ICD-10-CM | POA: Insufficient documentation

## 2024-02-21 DIAGNOSIS — Z5321 Procedure and treatment not carried out due to patient leaving prior to being seen by health care provider: Secondary | ICD-10-CM | POA: Insufficient documentation

## 2024-02-21 LAB — I-STAT CHEM 8, ED
BUN: 7 mg/dL (ref 6–20)
Calcium, Ion: 1.17 mmol/L (ref 1.15–1.40)
Chloride: 100 mmol/L (ref 98–111)
Creatinine, Ser: 0.8 mg/dL (ref 0.44–1.00)
Glucose, Bld: 108 mg/dL — ABNORMAL HIGH (ref 70–99)
HCT: 39 % (ref 36.0–46.0)
Hemoglobin: 13.3 g/dL (ref 12.0–15.0)
Potassium: 3.7 mmol/L (ref 3.5–5.1)
Sodium: 139 mmol/L (ref 135–145)
TCO2: 24 mmol/L (ref 22–32)

## 2024-02-21 LAB — CBC
HCT: 38.8 % (ref 36.0–46.0)
Hemoglobin: 12.5 g/dL (ref 12.0–15.0)
MCH: 26.5 pg (ref 26.0–34.0)
MCHC: 32.2 g/dL (ref 30.0–36.0)
MCV: 82.4 fL (ref 80.0–100.0)
Platelets: 367 10*3/uL (ref 150–400)
RBC: 4.71 MIL/uL (ref 3.87–5.11)
RDW: 13.6 % (ref 11.5–15.5)
WBC: 11.9 10*3/uL — ABNORMAL HIGH (ref 4.0–10.5)
nRBC: 0 % (ref 0.0–0.2)

## 2024-02-21 LAB — HCG, SERUM, QUALITATIVE: Preg, Serum: NEGATIVE

## 2024-02-21 NOTE — ED Triage Notes (Signed)
 The pt was here yesterday to be seen but left before she was called.  The pt is c/o chest pain  nausea and diarrhea no temp lmp now

## 2024-02-21 NOTE — ED Notes (Signed)
 Pt told she had a room available. Pt stated she was planning on leaving, even upon explaining she had an opportunity to speak to a doctor. Pt left AMA.

## 2024-02-22 LAB — TROPONIN T, HIGH SENSITIVITY
Troponin T High Sensitivity: 8 ng/L (ref 0–19)
Troponin T High Sensitivity: <6 ng/L (ref 0–19)

## 2024-02-23 ENCOUNTER — Encounter (HOSPITAL_COMMUNITY): Payer: Self-pay | Admitting: Emergency Medicine

## 2024-02-23 ENCOUNTER — Other Ambulatory Visit: Payer: Self-pay

## 2024-02-23 ENCOUNTER — Emergency Department (HOSPITAL_COMMUNITY)
Admission: EM | Admit: 2024-02-23 | Discharge: 2024-02-23 | Disposition: A | Discharge status: Home / Self Care | Payer: MEDICAID | Source: Home / Self Care | Attending: Emergency Medicine | Admitting: Emergency Medicine

## 2024-02-23 DIAGNOSIS — R202 Paresthesia of skin: Secondary | ICD-10-CM

## 2024-02-23 LAB — CBC WITH DIFFERENTIAL/PLATELET
Abs Immature Granulocytes: 0.04 10*3/uL (ref 0.00–0.07)
Basophils Absolute: 0 10*3/uL (ref 0.0–0.1)
Basophils Relative: 1 %
Eosinophils Absolute: 0.1 10*3/uL (ref 0.0–0.5)
Eosinophils Relative: 1 %
HCT: 40.7 % (ref 36.0–46.0)
Hemoglobin: 12.8 g/dL (ref 12.0–15.0)
Immature Granulocytes: 1 %
Lymphocytes Relative: 28 %
Lymphs Abs: 2.2 10*3/uL (ref 0.7–4.0)
MCH: 25.8 pg — ABNORMAL LOW (ref 26.0–34.0)
MCHC: 31.4 g/dL (ref 30.0–36.0)
MCV: 82.1 fL (ref 80.0–100.0)
Monocytes Absolute: 0.5 10*3/uL (ref 0.1–1.0)
Monocytes Relative: 7 %
Neutro Abs: 5 10*3/uL (ref 1.7–7.7)
Neutrophils Relative %: 62 %
Platelets: 316 10*3/uL (ref 150–400)
RBC: 4.96 MIL/uL (ref 3.87–5.11)
RDW: 13.6 % (ref 11.5–15.5)
WBC: 7.9 10*3/uL (ref 4.0–10.5)
nRBC: 0 % (ref 0.0–0.2)

## 2024-02-23 LAB — HEMOGLOBIN A1C
Hgb A1c MFr Bld: 5.9 % — ABNORMAL HIGH (ref 4.8–5.6)
Mean Plasma Glucose: 122.63 mg/dL

## 2024-02-23 LAB — BASIC METABOLIC PANEL WITH GFR
Anion gap: 10 (ref 5–15)
BUN: 8 mg/dL (ref 6–20)
CO2: 26 mmol/L (ref 22–32)
Calcium: 9 mg/dL (ref 8.9–10.3)
Chloride: 100 mmol/L (ref 98–111)
Creatinine, Ser: 0.7 mg/dL (ref 0.44–1.00)
GFR, Estimated: >60 mL/min
Glucose, Bld: 99 mg/dL (ref 70–99)
Potassium: 3.9 mmol/L (ref 3.5–5.1)
Sodium: 135 mmol/L (ref 135–145)

## 2024-02-23 LAB — CBG MONITORING, ED: Glucose-Capillary: 99 mg/dL (ref 70–99)

## 2024-02-23 LAB — TSH: TSH: 0.82 u[IU]/mL (ref 0.350–4.500)

## 2024-02-23 LAB — MAGNESIUM: Magnesium: 2 mg/dL (ref 1.7–2.4)

## 2024-02-23 LAB — HCG, SERUM, QUALITATIVE: Preg, Serum: NEGATIVE

## 2024-02-23 MED ORDER — GABAPENTIN 100 MG PO CAPS
100.0000 mg | ORAL_CAPSULE | Freq: Every day | ORAL | 0 refills | Status: AC
  Filled 2024-02-23: qty 30, 30d supply, fill #0

## 2024-02-23 NOTE — Discharge Instructions (Addendum)
 You were seen in the emergency department for tingling in your toes.  Your evaluation is reassuring.  Your hemoglobin A1c is slightly elevated which indicates that you may be having high sugars at times.  Please check your blood sugar in the morning when you first wake up and about 1 to 2 hours after a heavy meal and record these values to discuss with your doctor.  In the meantime, please cut out highly processed foods and simple carbohydrates.  Read about low glycemic foods and limit your diet to include these.  This should improve your glucose levels and help with your symptoms.  I am also prescribing gabapentin  which you can take at night to help with the discomfort.  You are starting at 100 mg at bedtime and you can increase this to 200 mg at bedtime after a week and then 300 mg at bedtime after 2 weeks if you are tolerating it well.

## 2024-02-23 NOTE — ED Triage Notes (Signed)
 Patient c/o tingling to the top of her toes x 2 days.  Patient states she's not sure if it's related to her gout or her blood sugar. Patient has not been checking her blood sugar.

## 2024-02-23 NOTE — ED Provider Notes (Incomplete)
 " [place] [facility] Provider Note   CSN: [id number] Arrival date & time: 02/23/24  0220     Patient presents with: Tingling   Martha Lopez is a 34 y.o. female.  {Add pertinent medical, surgical, social history, OB history to HPI:32947} HPI     Prior to Admission medications  Medication Sig Start Date End Date Taking? Authorizing Provider  benzonatate  (TESSALON ) 100 MG capsule Take 2 capsules (200 mg total) by mouth 3 (three) times daily as needed for cough. 12/17/23   Sofia, Leslie K, PA-C  busPIRone  (BUSPAR ) 10 MG tablet Take 1 tablet (10 mg total) by mouth 3 (three) times daily. Patient not taking: Reported on 08/09/2023 01/25/22   Harl Zane BRAVO, NP  cyclobenzaprine  (FLEXERIL ) 10 MG tablet Take 1 tablet (10 mg total) by mouth 2 (two) times daily as needed for muscle spasms. 12/03/23   Robinson, John K, PA-C  escitalopram  (LEXAPRO ) 20 MG tablet Take 1 tablet (20 mg total) by mouth daily. Patient not taking: Reported on 08/09/2023 08/29/22   Parsons, Brittney E, NP  hydrocortisone  (ANUSOL -HC) 2.5 % rectal cream Place 1 application rectally 2 (two) times daily. Patient not taking: Reported on 08/09/2023 12/08/20   Jerrol Agent, MD  lidocaine  (LIDODERM ) 5 % Place 1 patch onto the skin daily. Remove & Discard patch within 12 hours or as directed by MD 12/03/23   Lang Norleen POUR, PA-C  omeprazole  (PRILOSEC) 20 MG capsule Take 1 capsule (20 mg total) by mouth daily. 01/21/24   Myriam Dorn BROCKS, PA  risperiDONE  (RISPERDAL ) 1 MG tablet Take 1 tablet (1 mg total) by mouth 2 (two) times daily. Patient not taking: Reported on 08/09/2023 03/06/23   Cleotilde Rogue, MD  risperiDONE  (RISPERDAL ) 1 MG tablet Take 1 tablet (1 mg total) by mouth 2 (two) times daily. Patient not taking: Reported on 08/09/2023 03/06/23   Mardy Legacy, NP  traZODone  (DESYREL ) 150 MG tablet Take 1 tablet (150 mg total) by mouth at bedtime as needed for sleep. Patient not taking:  Reported on 08/09/2023 08/29/22   Harl Zane BRAVO, NP    Allergies: Patient has no known allergies.    Review of Systems  Updated Vital Signs BP 116/86 (BP Location: Right Arm)   Pulse 67   Temp 97.7 F (36.5 C) (Oral)   Resp 16   Wt 72.6 kg   LMP 02/21/2024   SpO2 100%   BMI 28.35 kg/m   Physical Exam  (all labs ordered are listed, but only abnormal results are displayed) Labs Reviewed  CBG MONITORING, ED    EKG: None  Radiology: DG Chest 2 View Result Date: 02/21/2024 EXAM: 2 VIEW(S) XRAY OF THE CHEST 02/21/2024 11:44:00 PM COMPARISON: 02/20/2024 CLINICAL HISTORY: Chest pain for 3-4 days. FINDINGS: LUNGS AND PLEURA: No focal pulmonary opacity. No pleural effusion. No pneumothorax. HEART AND MEDIASTINUM: No acute abnormality of the cardiac and mediastinal silhouettes. BONES AND SOFT TISSUES: No acute osseous abnormality. IMPRESSION: 1. No acute cardiopulmonary abnormality. Electronically signed by: Greig Pique MD 02/21/2024 11:48 PM EST RP Workstation: [id number]    {Document cardiac monitor, telemetry assessment procedure when appropriate:32947} Procedures   Medications Ordered in the ED - No data to display    {Click here for ABCD2, HEART and other calculators REFRESH Note before signing:1}                              Medical Decision Making Amount  and/or Complexity of Data Reviewed Labs: ordered.  Risk Prescription drug management.   ***  {Document critical care time when appropriate  Document review of labs and clinical decision tools ie CHADS2VASC2, etc  Document your independent review of radiology images and any outside records  Document your discussion with family members, caretakers and with consultants  Document social determinants of health affecting pt's care  Document your decision making why or why not admission, treatments were needed:32947:::1}   Final diagnoses:  None    ED Discharge Orders     None        "
